# Patient Record
Sex: Female | Born: 1980 | Race: Black or African American | Hispanic: No | Marital: Married | Smoking: Never smoker
Health system: Southern US, Community
[De-identification: ages and names within clinical notes are randomized; demographics above are authoritative.]

## PROBLEM LIST (undated history)

## (undated) ENCOUNTER — Inpatient Hospital Stay (HOSPITAL_COMMUNITY): Payer: Self-pay

## (undated) DIAGNOSIS — R112 Nausea with vomiting, unspecified: Secondary | ICD-10-CM

## (undated) DIAGNOSIS — G40909 Epilepsy, unspecified, not intractable, without status epilepticus: Secondary | ICD-10-CM

## (undated) DIAGNOSIS — R0602 Shortness of breath: Secondary | ICD-10-CM

## (undated) DIAGNOSIS — G4730 Sleep apnea, unspecified: Secondary | ICD-10-CM

## (undated) DIAGNOSIS — K219 Gastro-esophageal reflux disease without esophagitis: Secondary | ICD-10-CM

## (undated) DIAGNOSIS — E669 Obesity, unspecified: Secondary | ICD-10-CM

## (undated) DIAGNOSIS — Z9889 Other specified postprocedural states: Secondary | ICD-10-CM

## (undated) DIAGNOSIS — G43909 Migraine, unspecified, not intractable, without status migrainosus: Secondary | ICD-10-CM

## (undated) DIAGNOSIS — J45909 Unspecified asthma, uncomplicated: Secondary | ICD-10-CM

## (undated) DIAGNOSIS — E119 Type 2 diabetes mellitus without complications: Secondary | ICD-10-CM

## (undated) DIAGNOSIS — R221 Localized swelling, mass and lump, neck: Secondary | ICD-10-CM

## (undated) HISTORY — PX: MOUTH SURGERY: SHX715

## (undated) HISTORY — DX: Obesity, unspecified: E66.9

## (undated) HISTORY — DX: Sleep apnea, unspecified: G47.30

## (undated) HISTORY — DX: Migraine, unspecified, not intractable, without status migrainosus: G43.909

## (undated) HISTORY — DX: Epilepsy, unspecified, not intractable, without status epilepticus: G40.909

## (undated) HISTORY — DX: Localized swelling, mass and lump, neck: R22.1

---
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
## 2000-12-08 ENCOUNTER — Other Ambulatory Visit: Admission: RE | Admit: 2000-12-08 | Discharge: 2000-12-08 | Payer: Self-pay | Admitting: Gynecology

## 2001-12-15 ENCOUNTER — Other Ambulatory Visit: Admission: RE | Admit: 2001-12-15 | Discharge: 2001-12-15 | Payer: Self-pay | Admitting: Gynecology

## 2002-01-25 ENCOUNTER — Encounter: Payer: Self-pay | Admitting: Emergency Medicine

## 2002-01-25 ENCOUNTER — Emergency Department (HOSPITAL_COMMUNITY): Admission: EM | Admit: 2002-01-25 | Discharge: 2002-01-25 | Payer: Self-pay | Admitting: Emergency Medicine

## 2002-07-02 ENCOUNTER — Encounter: Payer: Self-pay | Admitting: Emergency Medicine

## 2002-07-02 ENCOUNTER — Emergency Department (HOSPITAL_COMMUNITY): Admission: EM | Admit: 2002-07-02 | Discharge: 2002-07-02 | Payer: Self-pay | Admitting: Emergency Medicine

## 2003-01-01 ENCOUNTER — Other Ambulatory Visit: Admission: RE | Admit: 2003-01-01 | Discharge: 2003-01-01 | Payer: Self-pay | Admitting: Gynecology

## 2004-03-26 ENCOUNTER — Other Ambulatory Visit: Admission: RE | Admit: 2004-03-26 | Discharge: 2004-03-26 | Payer: Self-pay | Admitting: Gynecology

## 2006-12-10 ENCOUNTER — Emergency Department (HOSPITAL_COMMUNITY): Admission: EM | Admit: 2006-12-10 | Discharge: 2006-12-10 | Payer: Self-pay | Admitting: Emergency Medicine

## 2008-02-14 ENCOUNTER — Emergency Department (HOSPITAL_COMMUNITY): Admission: EM | Admit: 2008-02-14 | Discharge: 2008-02-15 | Payer: Self-pay | Admitting: Emergency Medicine

## 2008-09-03 ENCOUNTER — Emergency Department (HOSPITAL_COMMUNITY): Admission: EM | Admit: 2008-09-03 | Discharge: 2008-09-04 | Payer: Self-pay | Admitting: Emergency Medicine

## 2008-09-21 ENCOUNTER — Emergency Department (HOSPITAL_COMMUNITY): Admission: EM | Admit: 2008-09-21 | Discharge: 2008-09-21 | Payer: Self-pay | Admitting: Emergency Medicine

## 2008-11-19 ENCOUNTER — Inpatient Hospital Stay (HOSPITAL_COMMUNITY): Admission: AD | Admit: 2008-11-19 | Discharge: 2008-11-19 | Payer: Self-pay | Admitting: Obstetrics and Gynecology

## 2009-03-23 ENCOUNTER — Emergency Department (HOSPITAL_BASED_OUTPATIENT_CLINIC_OR_DEPARTMENT_OTHER): Admission: EM | Admit: 2009-03-23 | Discharge: 2009-03-23 | Payer: Self-pay | Admitting: Emergency Medicine

## 2009-04-16 ENCOUNTER — Inpatient Hospital Stay (HOSPITAL_COMMUNITY): Admission: AD | Admit: 2009-04-16 | Discharge: 2009-04-16 | Payer: Self-pay | Admitting: Obstetrics and Gynecology

## 2009-04-16 ENCOUNTER — Ambulatory Visit: Payer: Self-pay | Admitting: Advanced Practice Midwife

## 2009-04-21 ENCOUNTER — Inpatient Hospital Stay (HOSPITAL_COMMUNITY): Admission: AD | Admit: 2009-04-21 | Discharge: 2009-04-21 | Payer: Self-pay | Admitting: Obstetrics and Gynecology

## 2009-05-16 ENCOUNTER — Inpatient Hospital Stay (HOSPITAL_COMMUNITY): Admission: AD | Admit: 2009-05-16 | Discharge: 2009-05-16 | Payer: Self-pay | Admitting: Obstetrics and Gynecology

## 2009-06-17 ENCOUNTER — Other Ambulatory Visit: Payer: Self-pay | Admitting: Emergency Medicine

## 2009-06-17 ENCOUNTER — Inpatient Hospital Stay (HOSPITAL_COMMUNITY): Admission: AD | Admit: 2009-06-17 | Discharge: 2009-06-18 | Payer: Self-pay | Admitting: Obstetrics and Gynecology

## 2009-07-15 ENCOUNTER — Inpatient Hospital Stay (HOSPITAL_COMMUNITY): Admission: AD | Admit: 2009-07-15 | Discharge: 2009-07-20 | Payer: Self-pay | Admitting: Obstetrics and Gynecology

## 2009-07-21 ENCOUNTER — Inpatient Hospital Stay (HOSPITAL_COMMUNITY): Admission: AD | Admit: 2009-07-21 | Discharge: 2009-07-21 | Payer: Self-pay | Admitting: Obstetrics and Gynecology

## 2009-11-24 ENCOUNTER — Emergency Department (HOSPITAL_COMMUNITY): Admission: EM | Admit: 2009-11-24 | Discharge: 2009-11-24 | Payer: Self-pay | Admitting: Emergency Medicine

## 2010-07-10 ENCOUNTER — Emergency Department (HOSPITAL_COMMUNITY): Admission: EM | Admit: 2010-07-10 | Discharge: 2010-07-10 | Payer: Self-pay | Admitting: Emergency Medicine

## 2011-03-23 LAB — LIPASE, BLOOD: Lipase: 16 U/L (ref 11–59)

## 2011-03-23 LAB — COMPREHENSIVE METABOLIC PANEL
ALT: 15 U/L (ref 0–35)
AST: 19 U/L (ref 0–37)
CO2: 23 mEq/L (ref 19–32)
Calcium: 8.9 mg/dL (ref 8.4–10.5)
Chloride: 104 mEq/L (ref 96–112)
GFR calc Af Amer: >60 mL/min (ref >60)
GFR calc non Af Amer: >60 mL/min (ref >60)
Glucose, Bld: 101 mg/dL — ABNORMAL HIGH (ref 70–99)
Sodium: 137 mEq/L (ref 135–145)
Total Bilirubin: 0.6 mg/dL (ref 0.3–1.2)

## 2011-03-23 LAB — DIFFERENTIAL
Basophils Absolute: 0 10*3/uL (ref 0.0–0.1)
Basophils Relative: 0 % (ref 0–1)
Eosinophils Absolute: 0 10*3/uL (ref 0.0–0.7)
Eosinophils Relative: 0 % (ref 0–5)
Lymphs Abs: 0.7 10*3/uL (ref 0.7–4.0)
Neutrophils Relative %: 92 % — ABNORMAL HIGH (ref 43–77)

## 2011-03-23 LAB — CBC
Hemoglobin: 11.9 g/dL — ABNORMAL LOW (ref 12.0–15.0)
MCHC: 33.2 g/dL (ref 30.0–36.0)
MCV: 76.1 fL — ABNORMAL LOW (ref 78.0–100.0)
RBC: 4.72 MIL/uL (ref 3.87–5.11)
WBC: 14 10*3/uL — ABNORMAL HIGH (ref 4.0–10.5)

## 2011-03-23 LAB — URINALYSIS, ROUTINE W REFLEX MICROSCOPIC
Bilirubin Urine: NEGATIVE
Hgb urine dipstick: NEGATIVE
Nitrite: NEGATIVE
Specific Gravity, Urine: 1.027 (ref 1.005–1.030)
pH: 5.5 (ref 5.0–8.0)

## 2011-03-23 LAB — POCT PREGNANCY, URINE: Preg Test, Ur: NEGATIVE

## 2011-03-27 LAB — URINALYSIS, ROUTINE W REFLEX MICROSCOPIC
Glucose, UA: NEGATIVE mg/dL
Hgb urine dipstick: NEGATIVE
Ketones, ur: NEGATIVE mg/dL
pH: 5.5 (ref 5.0–8.0)

## 2011-03-27 LAB — COMPREHENSIVE METABOLIC PANEL
ALT: 29 U/L (ref 0–35)
Albumin: 2.3 g/dL — ABNORMAL LOW (ref 3.5–5.2)
Alkaline Phosphatase: 158 U/L — ABNORMAL HIGH (ref 39–117)
Calcium: 7.9 mg/dL — ABNORMAL LOW (ref 8.4–10.5)
GFR calc Af Amer: >60 mL/min (ref >60)
Glucose, Bld: 79 mg/dL (ref 70–99)
Potassium: 3.3 mEq/L — ABNORMAL LOW (ref 3.5–5.1)
Sodium: 135 mEq/L (ref 135–145)
Total Protein: 5.7 g/dL — ABNORMAL LOW (ref 6.0–8.3)

## 2011-03-27 LAB — CBC
Hemoglobin: 8.7 g/dL — ABNORMAL LOW (ref 12.0–15.0)
MCHC: 32.7 g/dL (ref 30.0–36.0)
Platelets: 269 10*3/uL (ref 150–400)
RDW: 17.5 % — ABNORMAL HIGH (ref 11.5–15.5)

## 2011-03-28 LAB — URINALYSIS, ROUTINE W REFLEX MICROSCOPIC
Nitrite: NEGATIVE
Specific Gravity, Urine: 1.015 (ref 1.005–1.030)
Urobilinogen, UA: 0.2 mg/dL (ref 0.0–1.0)

## 2011-03-28 LAB — COMPREHENSIVE METABOLIC PANEL
CO2: 24 mEq/L (ref 19–32)
Calcium: 8.1 mg/dL — ABNORMAL LOW (ref 8.4–10.5)
Creatinine, Ser: 0.81 mg/dL (ref 0.4–1.2)
GFR calc non Af Amer: >60 mL/min (ref >60)
Glucose, Bld: 92 mg/dL (ref 70–99)

## 2011-03-28 LAB — CBC
HCT: 32.5 % — ABNORMAL LOW (ref 36.0–46.0)
Hemoglobin: 10.4 g/dL — ABNORMAL LOW (ref 12.0–15.0)
Hemoglobin: 10.8 g/dL — ABNORMAL LOW (ref 12.0–15.0)
MCHC: 33 g/dL (ref 30.0–36.0)
MCV: 75.1 fL — ABNORMAL LOW (ref 78.0–100.0)
MCV: 76.9 fL — ABNORMAL LOW (ref 78.0–100.0)
RBC: 4.09 MIL/uL (ref 3.87–5.11)
RDW: 16.9 % — ABNORMAL HIGH (ref 11.5–15.5)
RDW: 17.6 % — ABNORMAL HIGH (ref 11.5–15.5)
WBC: 13.2 10*3/uL — ABNORMAL HIGH (ref 4.0–10.5)

## 2011-03-28 LAB — URINE MICROSCOPIC-ADD ON

## 2011-03-28 LAB — URIC ACID: Uric Acid, Serum: 4.9 mg/dL (ref 2.4–7.0)

## 2011-03-29 LAB — COMPREHENSIVE METABOLIC PANEL
BUN: 3 mg/dL — ABNORMAL LOW (ref 6–23)
Calcium: 8.7 mg/dL (ref 8.4–10.5)
Creatinine, Ser: 0.57 mg/dL (ref 0.4–1.2)
Glucose, Bld: 96 mg/dL (ref 70–99)
Sodium: 137 mEq/L (ref 135–145)
Total Protein: 5.9 g/dL — ABNORMAL LOW (ref 6.0–8.3)

## 2011-03-29 LAB — URINE CULTURE

## 2011-03-29 LAB — CBC
HCT: 31.2 % — ABNORMAL LOW (ref 36.0–46.0)
Hemoglobin: 10.3 g/dL — ABNORMAL LOW (ref 12.0–15.0)
MCHC: 33 g/dL (ref 30.0–36.0)
RDW: 16.4 % — ABNORMAL HIGH (ref 11.5–15.5)

## 2011-03-29 LAB — LACTATE DEHYDROGENASE: LDH: 111 U/L (ref 94–250)

## 2011-03-29 LAB — URIC ACID: Uric Acid, Serum: 2.9 mg/dL (ref 2.4–7.0)

## 2011-03-29 LAB — URINALYSIS, ROUTINE W REFLEX MICROSCOPIC
Ketones, ur: NEGATIVE mg/dL
Nitrite: NEGATIVE
pH: 7 (ref 5.0–8.0)

## 2011-03-29 LAB — URINE MICROSCOPIC-ADD ON

## 2011-05-04 NOTE — Discharge Summary (Signed)
Jaime Riggs, Jaime Riggs              ACCOUNT NO.:  [id number]   MEDICAL RECORD NO.:  [id number]          PATIENT TYPE:  INP   LOCATION:  9309                          FACILITY:  [facility]   PHYSICIAN:  Malachi Pro. Ambrose Mantle, M.D. DATE OF BIRTH:  September 29, 1981   DATE OF ADMISSION:  07/15/2009  DATE OF DISCHARGE:  07/20/2009                               DISCHARGE SUMMARY   HISTORY:  A 30 year old black female, para 0, gravida 1, estimated  gestational age 59 plus weeks by 11-week ultrasound with EDC on July 10, 2009, presented complaining of contractions.  She was seen in the office  earlier on the day of admission, had nausea and vomiting.  Vaginal exam  showed the cervix to be 2-3 cm, 50%, vertex at -3.  The patient came to  maternity admission with irregular contractions and she was admitted for  augmentation.  RPR was nonreactive, hepatitis B surface antigen  negative, rubella immune, HIV negative, O positive, negative antibody,  GC and chlamydia negative, and cystic fibrosis negative.  First  trimester screen was normal.  One-hour Glucola 115.  Group B strep  negative.   PRENATAL CARE:  The patient was seen [facility] for  headache.  She was treated with trigger point injections, had  intermittent nausea and vomiting, questionable seizure at 36 weeks with  normal evaluation at [facility].  She was on Valtrex for  suppression of HSV.  No lesions or symptoms.   GYN HISTORY:  History of GC and chlamydia and herpes simplex.   PAST MEDICAL HISTORY:  Remote history of asthma, anxiety, depression,  seizure in 2003, and migraines.   SURGICAL HISTORY:  Wisdom teeth.   ALLERGIES:  No known drug allergies.   MEDICATIONS:  Valtrex.   On admission, she was afebrile with normal vital signs.  Fetal heart  tones were normal.  Estimated fetal weight was 9 pounds.  Cervix was 2-3  cm and 50%.  Heart and lungs are normal.  The patient was admitted and  placed on Pitocin.  She  received an epidural by 7:05 a.m. her cervix was  6 cm, 80%, vertex at a -1.  Artificial rupture of membranes produced  clear fluid.  The patient had fallen out of bed earlier in this morning,  9:15 a.m., the cervix was 8 cm, 90%.  She progressed to 9 cm at 1:15  p.m., and at 2:15 p.m., the patient was fully dilated.  She pushed well,  but delivered failed to descend and the patient was taken the operating  room for low transverse cervical C-section by Dr. Senaida Ores, with  delivery of an 8 pounds 7 ounces female infant with Apgars of 8 at 1 and  9at 5 minutes, baby was in OP position.  Postoperatively, the patient  was relatively unremarkable.   POSTNATAL COURSE:  The baby was taken to the ICU because of possible  infection and will be kept for 7 days.  Because of this, the patient did  not want to go home and was kept until the force postop day, at which  time her staples were  removed and strips were applied.  Initial  hemoglobin was 10.8, hematocrit 32.5, white count 13,200, and platelet  count 169,000.  Followup hemoglobin 10.4.  Comprehensive metabolic  profile was normal except for sodium of 134, BUN of 5, total protein of  5.4, albumin of 2.3, and alkaline phosphatase at 144.  Urinalysis was  normal except for 1+ protein 30 mg/dL.  RPR was nonreactive.  At the  time of discharge, the patient is ambulating well.  She is tolerating a  regular diet and passing flatus.  Her last prenatal weight that we had  recorded on July 08, 2009, was 270 pounds, and on July 19, 2009, she did  weight 277 pounds.  She does have significant edema, but her blood  pressures normal.  She was advised to low-salt diet and frequent  ambulation.   DISCHARGE DIAGNOSIS:  Intrauterine pregnancy at 40 plus weeks, delivered  vertex by cesarean section.   OPERATION:  Low-transverse cervical cesarean section.   FINAL CONDITION:  Improved.   INSTRUCTIONS:  Our regular discharge instruction booklet.   DISCHARGE  MEDICATIONS:  1. Percocet 5/325, 36 tablets, 1 every 4-6 hours as needed for pain.  2. Motrin 600 mg 30 tablets 1 every 6 hours as needed.   FOLLOWUP:  The patient was advised to call with any signs or symptoms of  postpartum depression, and to avoid salt.  Return to the office in 10-14  days.      Malachi Pro. Ambrose Mantle, M.D.  Electronically Signed     TFH/MEDQ  D:  07/20/2009  T:  07/20/2009  Job:  [id number]

## 2011-05-04 NOTE — Op Note (Signed)
Jaime Riggs, Jaime Riggs NO.:  [id number]   MEDICAL RECORD NO.:  [id number]           PATIENT TYPE:   LOCATION:                                 FACILITY:   PHYSICIAN:  Huel Cote, M.D. DATE OF BIRTH:  02/10/1981   DATE OF PROCEDURE:  07/16/2009  DATE OF DISCHARGE:                               OPERATIVE REPORT   PREOPERATIVE DIAGNOSES:  1. Term pregnancy at 40-6/7th weeks, delivered  2. Arrest of descent.  3. Fetal bradycardia in OR 260.   PROCEDURE:  Primary low transverse cesarean section with two-layer  closure of uterus.   SURGEON:  Huel Cote, MD   ANESTHESIA:  Epidural.   FINDINGS:  There is a viable female infant in the vertex presentation, OP  position.  Apgars were 8 and 9, weight was 8 pounds 7 ounces.  Normal  uterus, tubes, and ovaries were noted.   SPECIMENS:  Placenta was taken to L&D after cord blood collection.   ESTIMATED BLOOD LOSS:  800 mL.   URINE OUTPUT:  Approximately 100 mL clear urine.   INTRAVENOUS FLUIDS:  1500 mL LR.   PROCEDURE:  The patient was taken to the operating room where epidural  anesthesia was dosed upon arrival to the OR.  The fetal scalp electrode  was removed and the heart rate was identified with electronic fetal  monitoring externally.  At this point, the fetal heart rate was noted to  be 60 and position change was attempted with no discernible recovery of  heart rate.  The patient was ready for C-section, so her abdomen was  quickly prepped and sterile drape applied with the abdomen tested and  found to be adequately anesthetized.  A Pfannenstiel incision was  quickly made and carried through to underlying layer of fascia by sharp  dissection and Bovie cautery.  The fascia was nicked in the midline and  the incision extended laterally with Mayo scissors.  The inferior aspect  was elevated and dissected off the rectus muscles.  Superior aspect  similarly was dissected off the rectus muscles.  These  were separated in  the midline and the incision extended bluntly.  When the peritoneum had  been entered, an Alexis self-retaining wound retractor was placed within  the incision and the uterus incised in low transverse fashion.  The  infant was in the OP presentation and the vertex was deep within the  pelvis.  The vertex was delivered up to the level of the incision and  fundal pressure applied with no delivery.  Therefore, the soft cup  vacuum was applied and the infant's head delivered atraumatically, and  nose and mouth bulb suctioned.  The remainder of the infant delivered  atraumatically.  Cord was clamped and the infant did have spontaneous  cry, and was handed off to the pediatricians.  Apgars were 8 and 9 as  stated.  Upon delivery of the infant, the uterus was massaged and the  placenta expressed spontaneously and handed off for cord blood donation.  The uterus was cleared off all clots and debris with moist lap sponge  and was  noted to be somewhat atonic; therefore, the Pitocin was  increased and Methergine given x1.  The uterus responded well and at  this point, the uterine incision was then closed with 2 layers of 0  chromic, the first a running locked layer, and the second an imbricating  layer of the same suture.  The incision then appeared hemostatic.  The  pelvis was irrigated.  The ovaries and tubes inspected and found to be  normal.  At this point, there was no active bleeding noted and all  instruments were removed from the patient's abdomen.  The peritoneum and  muscles were reapproximated with 0 Vicryl in the midline and several  interrupted sutures.  The fascia was then closed with 0 Vicryl in a  running fashion.  The subcutaneous tissue was reapproximated with 3-0  plain in a running fashion and the skin was closed with staples.  Again,  sponge, lap, and needle counts were correct x2.  The patient was doing  well.  The baby was taken to the regular nursery and will  follow up with  the patient.  The patient was taken to the recovery room in good  condition.      Huel Cote, M.D.  Electronically Signed     KR/MEDQ  D:  07/16/2009  T:  07/17/2009  Job:  [id number]

## 2011-06-11 ENCOUNTER — Emergency Department (HOSPITAL_COMMUNITY)
Admission: EM | Admit: 2011-06-11 | Discharge: 2011-06-11 | Disposition: A | Discharge status: Home / Self Care | Payer: Self-pay | Attending: Emergency Medicine | Admitting: Emergency Medicine

## 2011-06-11 DIAGNOSIS — D649 Anemia, unspecified: Secondary | ICD-10-CM | POA: Insufficient documentation

## 2011-06-11 DIAGNOSIS — R4182 Altered mental status, unspecified: Secondary | ICD-10-CM | POA: Insufficient documentation

## 2011-06-11 LAB — BASIC METABOLIC PANEL
BUN: 12 mg/dL (ref 6–23)
Chloride: 102 mEq/L (ref 96–112)
GFR calc Af Amer: >60 mL/min (ref >60)
Glucose, Bld: 78 mg/dL (ref 70–99)
Potassium: 3.4 mEq/L — ABNORMAL LOW (ref 3.5–5.1)

## 2011-06-11 LAB — URINALYSIS, ROUTINE W REFLEX MICROSCOPIC
Hgb urine dipstick: NEGATIVE
Protein, ur: NEGATIVE mg/dL
Urobilinogen, UA: 0.2 mg/dL (ref 0.0–1.0)

## 2011-06-11 LAB — CBC
HCT: 30.9 % — ABNORMAL LOW (ref 36.0–46.0)
Hemoglobin: 9.9 g/dL — ABNORMAL LOW (ref 12.0–15.0)
WBC: 12.2 10*3/uL — ABNORMAL HIGH (ref 4.0–10.5)

## 2011-06-11 LAB — URINE MICROSCOPIC-ADD ON

## 2011-06-11 LAB — DIFFERENTIAL
Basophils Absolute: 0 10*3/uL (ref 0.0–0.1)
Lymphocytes Relative: 22 % (ref 12–46)
Neutro Abs: 8.5 10*3/uL — ABNORMAL HIGH (ref 1.7–7.7)
Neutrophils Relative %: 70 % (ref 43–77)

## 2011-06-11 LAB — POCT PREGNANCY, URINE: Preg Test, Ur: NEGATIVE

## 2011-09-10 LAB — DIFFERENTIAL
Basophils Absolute: 0.2 — ABNORMAL HIGH
Basophils Relative: 1
Eosinophils Relative: 0
Monocytes Absolute: 0.8

## 2011-09-10 LAB — I-STAT 8, (EC8 V) (CONVERTED LAB)
Acid-Base Excess: 1
BUN: 6
Bicarbonate: 26 — ABNORMAL HIGH
Chloride: 106
Glucose, Bld: 92
HCT: 42
Hemoglobin: 14.3
Operator id: 196461
Potassium: 5.1
Sodium: 138
TCO2: 27
pCO2, Ven: 41.4 — ABNORMAL LOW
pH, Ven: 7.406 — ABNORMAL HIGH

## 2011-09-10 LAB — URINALYSIS, ROUTINE W REFLEX MICROSCOPIC
Glucose, UA: NEGATIVE
Protein, ur: 30 — AB

## 2011-09-10 LAB — URINE MICROSCOPIC-ADD ON

## 2011-09-10 LAB — CBC
HCT: 34.7 — ABNORMAL LOW
Hemoglobin: 11 — ABNORMAL LOW
MCHC: 31.6
RDW: 17.2 — ABNORMAL HIGH

## 2011-09-10 LAB — POCT I-STAT CREATININE
Creatinine, Ser: 1
Operator id: 196461

## 2012-01-19 ENCOUNTER — Emergency Department (INDEPENDENT_AMBULATORY_CARE_PROVIDER_SITE_OTHER)
Admission: EM | Admit: 2012-01-19 | Discharge: 2012-01-19 | Disposition: A | Discharge status: Home / Self Care | Payer: Medicaid Other | Source: Home / Self Care | Attending: Family Medicine | Admitting: Family Medicine

## 2012-01-19 ENCOUNTER — Encounter (HOSPITAL_COMMUNITY): Payer: Self-pay | Admitting: *Deleted

## 2012-01-19 DIAGNOSIS — J111 Influenza due to unidentified influenza virus with other respiratory manifestations: Secondary | ICD-10-CM

## 2012-01-19 MED ORDER — OSELTAMIVIR PHOSPHATE 75 MG PO CAPS: 75.0000 mg | ORAL_CAPSULE | Freq: Two times a day (BID) | ORAL | Status: AC

## 2012-01-19 NOTE — ED Notes (Signed)
Pt  Has  Symptoms  Of  Cough   Congestion  Body  Aches   sorethroat     X  1  Day   She  Is  In  Private  Room  Masked    Appears  In no  Acute  Distress

## 2012-01-19 NOTE — ED Provider Notes (Signed)
History     CSN: [id number]  Arrival date & time 01/19/12  1123   First MD Initiated Contact with Patient 01/19/12 1203      Chief Complaint  Patient presents with  . Cough    (Consider location/radiation/quality/duration/timing/severity/associated sxs/prior treatment) Patient is a 31 y.o. female presenting with cough. The history is provided by the patient.  Cough This is a new problem. The current episode started yesterday. The problem occurs constantly. The problem has been gradually worsening. The cough is non-productive. There has been no fever. Associated symptoms include chills, sore throat and myalgias. Risk factors: did not take flu shot this season. She is not a smoker.    History reviewed. No pertinent past medical history.  Past Surgical History  Procedure Date  . Cesarean section     History reviewed. No pertinent family history.  History  Substance Use Topics  . Smoking status: Not on file  . Smokeless tobacco: Not on file  . Alcohol Use:     OB History    Grav Para Term Preterm Abortions TAB SAB Ect Mult Living                  Review of Systems  Constitutional: Positive for chills. Negative for fever.  HENT: Positive for congestion and sore throat.   Respiratory: Positive for cough.   Gastrointestinal: Negative.   Musculoskeletal: Positive for myalgias.  Skin: Negative.     Allergies  Review of patient's allergies indicates no known allergies.  Home Medications   Current Outpatient Rx  Name Route Sig Dispense Refill  . OSELTAMIVIR PHOSPHATE 75 MG PO CAPS Oral Take 1 capsule (75 mg total) by mouth every 12 (twelve) hours. 10 capsule 0    BP 141/84  Pulse 82  Temp(Src) 98.7 F (37.1 C) (Oral)  Resp 20  SpO2 100%  LMP 01/04/2012  Physical Exam  Nursing note and vitals reviewed. Constitutional: She is oriented to person, place, and time. She appears well-developed and well-nourished.  HENT:  Head: Normocephalic.  Right Ear:  External ear normal.  Left Ear: External ear normal.  Nose: Nose normal.  Mouth/Throat: Oropharynx is clear and moist.  Eyes: Pupils are equal, round, and reactive to light.  Neck: Normal range of motion. Neck supple.  Cardiovascular: Normal rate, regular rhythm, normal heart sounds and intact distal pulses.   Pulmonary/Chest: Effort normal and breath sounds normal.  Lymphadenopathy:    She has no cervical adenopathy.  Neurological: She is alert and oriented to person, place, and time.  Skin: Skin is warm and dry.    ED Course  Procedures (including critical care time)  Labs Reviewed - No data to display No results found.   1. Influenza       MDM          Barkley Bruns, MD 01/19/12 [phone]

## 2012-03-27 ENCOUNTER — Encounter (INDEPENDENT_AMBULATORY_CARE_PROVIDER_SITE_OTHER): Payer: Self-pay | Admitting: Surgery

## 2012-03-27 ENCOUNTER — Ambulatory Visit (INDEPENDENT_AMBULATORY_CARE_PROVIDER_SITE_OTHER): Payer: Medicaid Other | Admitting: Surgery

## 2012-03-27 VITALS — BP 122/84 | HR 88 | Temp 98.6°F | Resp 18 | Ht 67.0 in | Wt 271.8 lb

## 2012-03-27 DIAGNOSIS — R220 Localized swelling, mass and lump, head: Secondary | ICD-10-CM

## 2012-03-27 DIAGNOSIS — D171 Benign lipomatous neoplasm of skin and subcutaneous tissue of trunk: Secondary | ICD-10-CM | POA: Insufficient documentation

## 2012-03-27 DIAGNOSIS — E669 Obesity, unspecified: Secondary | ICD-10-CM

## 2012-03-27 DIAGNOSIS — R221 Localized swelling, mass and lump, neck: Secondary | ICD-10-CM

## 2012-03-27 HISTORY — DX: Obesity, unspecified: E66.9

## 2012-03-27 NOTE — Progress Notes (Signed)
Subjective:     Patient ID: Jaime Riggs, female   DOB: 24-Nov-1981, 31 y.o.   MRN: [id number]  HPI  Niyah Mamaril  Oct 04, 1981 [id number]  Patient Care Team: Jearld Lesch as PCP - General (Specialist)  This patient is a 31 y.o.female who presents today for surgical evaluation at the request of Dr. Mayford Knife.    Reason for visit: Enlarging posterior neck mass.  Patient is a morbidly obese pleasant female. She has had a mass on the back of her neck for many years. It was not particularly tender nor large. However, it has gotten much larger in the past year. She gets a lot of pain and discomfort with it now. Occasionally radiates to her back or to her chest. No other lesions except for a small lump in front of her right chin which he had since she was born. That has not changed. She saw her primary care physician. Recommended considering surgery.  She seems to be getting over a cold with some sniffling and cough. No swollen lymph nodes elsewhere. No other skin lesions. No history of MRSA. No tobacco  Patient Active Problem List  Diagnoses  . Mass of posterior neck 12x9x6cm  . Obesity, Class III, BMI 40-49.9 (morbid obesity)    Past Medical History  Diagnosis Date  . Migraines   . Neck mass   . Sleep apnea   . Epilepsy   . Obesity (BMI 30-39.9) 03/27/2012    Past Surgical History  Procedure Date  . Cesarean section   . Mouth surgery     History   Social History  . Marital Status: Single    Spouse Name: N/A    Number of Children: N/A  . Years of Education: N/A   Occupational History  . Not on file.   Social History Main Topics  . Smoking status: Never Smoker   . Smokeless tobacco: Not on file  . Alcohol Use: No  . Drug Use: No  . Sexually Active:    Other Topics Concern  . Not on file   Social History Narrative  . No narrative on file    Family History  Problem Relation Age of Onset  . Cancer Maternal Aunt     unknown  . Cancer Maternal Grandfather       lung  . Cancer Paternal Grandmother     ovarian    No current outpatient prescriptions on file.     No Known Allergies  BP 122/84  Pulse 88  Temp(Src) 98.6 F (37 C) (Temporal)  Resp 18  Ht 5\' 7"  (1.702 m)  Wt 271 lb 12.8 oz (123.288 kg)  BMI 42.57 kg/m2     Review of Systems  Constitutional: Negative for fever, chills, diaphoresis, appetite change and fatigue.  HENT: Positive for congestion and neck pain. Negative for hearing loss, ear pain, sore throat, trouble swallowing and ear discharge.   Eyes: Negative for photophobia, discharge and visual disturbance.  Respiratory: Positive for cough. Negative for choking, chest tightness and shortness of breath.   Cardiovascular: Positive for chest pain. Negative for palpitations.  Gastrointestinal: Negative for nausea, vomiting, abdominal pain, diarrhea, constipation, anal bleeding and rectal pain.  Genitourinary: Negative for dysuria, frequency and difficulty urinating.  Musculoskeletal: Negative for myalgias and gait problem.  Skin: Negative for color change, pallor and rash.  Neurological: Positive for weakness and headaches. Negative for dizziness, speech difficulty and numbness.  Hematological: Negative for adenopathy.  Psychiatric/Behavioral: Negative for confusion and agitation. The patient is not  nervous/anxious.        Objective:   Physical Exam  Constitutional: She is oriented to person, place, and time. She appears well-developed and well-nourished. No distress.  HENT:  Head: Normocephalic.  Mouth/Throat: Oropharynx is clear and moist. No oropharyngeal exudate.  Eyes: Conjunctivae and EOM are normal. Pupils are equal, round, and reactive to light. No scleral icterus.  Neck: Normal range of motion. Neck supple. No tracheal deviation present.    Cardiovascular: Normal rate, regular rhythm and intact distal pulses.   Pulmonary/Chest: Effort normal and breath sounds normal. No respiratory distress. She exhibits no  tenderness.  Abdominal: Soft. She exhibits no distension and no mass. There is no tenderness. Hernia confirmed negative in the right inguinal area and confirmed negative in the left inguinal area.  Genitourinary: No vaginal discharge found.  Musculoskeletal: Normal range of motion. She exhibits no tenderness.  Lymphadenopathy:    She has no cervical adenopathy.       Right: No inguinal adenopathy present.       Left: No inguinal adenopathy present.  Neurological: She is alert and oriented to person, place, and time. No cranial nerve deficit. She exhibits normal muscle tone. Coordination normal.  Skin: Skin is warm and dry. No rash noted. She is not diaphoretic. No erythema.  Psychiatric: She has a normal mood and affect. Her behavior is normal. Judgment and thought content normal.       Assessment:     Enlarging painful posterior neck mass, favor lipoma but muscle soreness a concern    Plan:     I think she would benefit from removal since it has gotten larger and become more tender. She is very interested in this. Decubitus position. Hopefully just with local & monitored deep sedation. However, given how sensitive it is & its size, she may require general anesthesia. She is concerned about postoperative nausea and vomiting since she had this after her dental surgery. I encouraged her to discuss it with the anesthesiologist so they can develop a plan to minimize such problems   I did discuss the procedure with her:  The pathophysiology of skin & subcutaneous masses was discussed.  Natural history risks without surgery were discussed.  I recommended surgery to remove the mass.  I explained the technique of removal with use of local anesthesia & possible need for more aggressive sedation/anesthesia for patient comfort.    Risks such as bleeding, infection, heart attack, death, and other risks were discussed.  I noted a good likelihood this will help address the problem.   Possibility that  this will not correct all symptoms was explained. Possibility of regrowth/recurrence of the mass was discussed.  We will work to minimize complications. Questions were answered.  The patient expresses understanding & wishes to proceed with surgery.

## 2012-03-27 NOTE — Patient Instructions (Addendum)
You most likely have a Lipoma on the back of your neck A lipoma is a noncancerous (benign) tumor composed of fat cells. They are usually found under the skin (subcutaneous). A lipoma may occur in any tissue of the body that contains fat. Common areas for lipomas to appear include the back, shoulders, buttocks, and thighs. Lipomas are a very common soft tissue growth. They are soft and grow slowly. Most problems caused by a lipoma depend on where it is growing. DIAGNOSIS  A lipoma can be diagnosed with a physical exam. These tumors rarely become cancerous, but radiographic studies can help determine this for certain. Studies used may include:  Computerized X-ray scans (CT or CAT scan).   Computerized magnetic scans (MRI).  TREATMENT  Small lipomas that are not causing problems may be watched. If a lipoma continues to enlarge or causes problems, removal is often the best treatment. Lipomas can also be removed to improve appearance. Surgery is done to remove the fatty cells and the surrounding capsule. Most often, this is done with medicine that numbs the area (local anesthetic). The removed tissue is examined under a microscope to make sure it is not cancerous. Keep all follow-up appointments with your caregiver. SEEK MEDICAL CARE IF:   The lipoma becomes larger or hard.   The lipoma becomes painful, red, or increasingly swollen. These could be signs of infection or a more serious condition.  Document Released: 11/26/2002 Document Revised: 11/25/2011 Document Reviewed: 05/08/2010 [facility] Patient Information 2012 [place], [place].

## 2012-04-04 ENCOUNTER — Other Ambulatory Visit (INDEPENDENT_AMBULATORY_CARE_PROVIDER_SITE_OTHER): Payer: Self-pay | Admitting: Surgery

## 2012-04-04 DIAGNOSIS — D210 Benign neoplasm of connective and other soft tissue of head, face and neck: Secondary | ICD-10-CM

## 2012-04-04 HISTORY — PX: NECK MASS EXCISION: SHX2079

## 2012-04-07 ENCOUNTER — Encounter (INDEPENDENT_AMBULATORY_CARE_PROVIDER_SITE_OTHER): Payer: Self-pay | Admitting: Surgery

## 2012-04-07 ENCOUNTER — Ambulatory Visit (INDEPENDENT_AMBULATORY_CARE_PROVIDER_SITE_OTHER): Payer: Medicaid Other | Admitting: Surgery

## 2012-04-07 VITALS — BP 128/72 | HR 68 | Temp 96.8°F | Resp 16 | Ht 66.0 in | Wt 274.0 lb

## 2012-04-07 DIAGNOSIS — D171 Benign lipomatous neoplasm of skin and subcutaneous tissue of trunk: Secondary | ICD-10-CM

## 2012-04-07 DIAGNOSIS — D1779 Benign lipomatous neoplasm of other sites: Secondary | ICD-10-CM

## 2012-04-07 NOTE — Progress Notes (Signed)
Subjective:     Patient ID: Jaime Riggs, female   DOB: 08-02-1981, 31 y.o.   MRN: [id number]  HPI  Jaime Riggs  [birth year] [id number]  Patient Care Team: Jearld Lesch as PCP - General (Specialist)  This patient is a 31 y.o.female who presents today for surgical evaluation.   Procedure: Removal of lipomatous neck mass 04/04/2012  Pathology: Benign fatty fibroadipose tissue. Consistent with lipoma.  Patient comes today with her significant other. They were concerned about the incision. Drain has been putting a moderate volume but is relatively thin. Pain is controlled. She is afraid use her arm. She is hoping to be more active. No fevers or chills. No drainage from the incision. Neck little sore.  Patient Active Problem List  Diagnoses  . Lipoma of posterior neck/upper back 12x9x6cm  . Obesity, Class III, BMI 40-49.9 (morbid obesity)    Past Medical History  Diagnosis Date  . Migraines   . Neck mass   . Sleep apnea   . Epilepsy   . Obesity (BMI 30-39.9) 03/27/2012    Past Surgical History  Procedure Date  . Cesarean section   . Mouth surgery   . Neck mass excision 04/04/2012    pathology c/w lipoma    History   Social History  . Marital Status: Married    Spouse Name: N/A    Number of Children: N/A  . Years of Education: N/A   Occupational History  . Not on file.   Social History Main Topics  . Smoking status: Never Smoker   . Smokeless tobacco: Not on file  . Alcohol Use: No  . Drug Use: No  . Sexually Active: Not on file   Other Topics Concern  . Not on file   Social History Narrative  . No narrative on file    Family History  Problem Relation Age of Onset  . Cancer Maternal Aunt     unknown  . Cancer Maternal Grandfather     lung  . Cancer Paternal Grandmother     ovarian    No current outpatient prescriptions on file.     No Known Allergies  BP 128/72  Pulse 68  Temp(Src) 96.8 F (36 C) (Temporal)  Resp 16  Ht 5\' 6"   (1.676 m)  Wt 274 lb (124.286 kg)  BMI 44.22 kg/m2     Review of Systems  Constitutional: Negative for fever, chills and diaphoresis.  HENT: Positive for neck pain and neck stiffness. Negative for ear pain, sore throat and trouble swallowing.   Eyes: Negative for photophobia and visual disturbance.  Respiratory: Negative for cough and choking.   Cardiovascular: Negative for chest pain and palpitations.  Gastrointestinal: Negative for nausea, vomiting, abdominal pain, diarrhea, constipation, anal bleeding and rectal pain.  Genitourinary: Negative for dysuria, frequency and difficulty urinating.  Musculoskeletal: Negative for myalgias and gait problem.  Skin: Negative for color change, pallor and rash.  Neurological: Negative for dizziness, speech difficulty, weakness and numbness.  Hematological: Negative for adenopathy.  Psychiatric/Behavioral: Negative for confusion and agitation. The patient is not nervous/anxious.        Objective:   Physical Exam  Constitutional: She is oriented to person, place, and time. She appears well-developed and well-nourished. No distress.  HENT:  Head: Normocephalic.  Mouth/Throat: Oropharynx is clear and moist. No oropharyngeal exudate.  Eyes: Conjunctivae and EOM are normal. Pupils are equal, round, and reactive to light. No scleral icterus.  Neck: Normal range of motion. No tracheal deviation  present.    Cardiovascular: Normal rate and intact distal pulses.   Pulmonary/Chest: Effort normal. No respiratory distress. She exhibits no tenderness.  Abdominal: Soft. She exhibits no distension. There is no tenderness. Hernia confirmed negative in the right inguinal area and confirmed negative in the left inguinal area.       Incisions clean with normal healing ridges.  No hernias  Genitourinary: No vaginal discharge found.  Musculoskeletal: Normal range of motion. She exhibits no tenderness.  Lymphadenopathy:       Right: No inguinal adenopathy  present.       Left: No inguinal adenopathy present.  Neurological: She is alert and oriented to person, place, and time. No cranial nerve deficit. She exhibits normal muscle tone. Coordination normal.  Skin: Skin is warm and dry. No rash noted. She is not diaphoretic.  Psychiatric: She has a normal mood and affect. Her behavior is normal.       Assessment:     POD 3 s/p excision of large neck/back lipoma.  Recovering OK    Plan:     Increase activity as tolerated.  Do not push through pain.  I noted is important to have a good pain control regimen.  Use right upper tremor he has tolerated. Do not push through pain. Okay to lie on back inside as tolerated. I spent a fair time going over postoperative instructions again and tried to reassure them. They felt more comfortable with management after our discussion  Return to clinic ~2 weeks for probable drain removal & follow-up.  The patient &signif. Other expressed understanding and appreciation

## 2012-04-07 NOTE — Patient Instructions (Addendum)
DRAIN CARE:   °You have a closed bulb drain to help you heal. ° °A bulb drain is a small, plastic reservoir which creates a gentle suction. It is used to remove excess fluid from a surgical wound. The color and amount of fluid will vary. Immediately after surgery, the fluid is bright red. It may gradually change to a yellow color. When the amount decreases to about 1 or 2 tablespoons (15 to 30 cc) per 24 hours, your caregiver will usually remove it. ° °DAILY CARE °· Keep the bulb compressed at all times, except while emptying it. The compression creates suction.  °· Keep sites where the tubes enter the skin dry and covered with a light bandage (dressing).  °· Tape the tubes to your skin, 1 to 2 inches below the insertion sites, to keep from pulling on your stitches. Tubes are stitched in place and will not slip out.  °· Pin the bulb to your shirt (not to your pants) with a safety pin.  °· For the first few days after surgery, there usually is more fluid in the bulb. Empty the bulb whenever it becomes half full because the bulb does not create enough suction if it is too full. Include this amount in your 24 hour totals.  °· When the amount of drainage decreases, empty the bulb at the same time every day. Write down the amounts and the 24 hour totals. Your caregiver will want to know them. This helps your caregiver know when the tubes can be removed.  °· (We anticipate removing the drain in 1-3 weeks, depending on when the output is <30mL a day for 2+ days) °· If there is drainage around the tube sites, change dressings and keep the area dry. If you see a clot in the tube, leave it alone. However, if the tube does not appear to be draining, let your caregiver know.  °TO EMPTY THE BULB °· Open the stopper to release suction.  °· Holding the stopper out of the way, pour drainage into the measuring cup that was sent home with you.  °· Measure and write down the amount. If there are 2 bulbs, note the amount of drainage  from bulb 1 or bulb 2 and keep the totals separate. Your caregiver will want to know which tube is draining more.  °· Compress the bulb by folding it in half.  °· Replace the stopper.  °· Check the tape that holds the tube to your skin, and pin the bulb to your shirt.  °SEEK MEDICAL CARE IF: °· The drainage develops a bad odor.  °· You have an oral temperature above 102° F (38.9° C).  °· The amount of drainage from your wound suddenly increases or decreases.  °· You accidentally pull out your drain.  °· You have any other questions or concerns.  °MAKE SURE YOU:  °· Understand these instructions.  °· Will watch your condition.  °· Will get help right away if you are not doing well or get worse.  ° ° °· Call our office if you have any questions about your drain. [phone] °·  °

## 2012-04-19 ENCOUNTER — Encounter (INDEPENDENT_AMBULATORY_CARE_PROVIDER_SITE_OTHER): Payer: Self-pay | Admitting: Surgery

## 2012-04-19 ENCOUNTER — Ambulatory Visit (INDEPENDENT_AMBULATORY_CARE_PROVIDER_SITE_OTHER): Payer: Medicaid Other | Admitting: Surgery

## 2012-04-19 VITALS — BP 127/81 | HR 85 | Temp 98.6°F | Ht 66.0 in | Wt 275.2 lb

## 2012-04-19 DIAGNOSIS — D171 Benign lipomatous neoplasm of skin and subcutaneous tissue of trunk: Secondary | ICD-10-CM

## 2012-04-19 DIAGNOSIS — D1779 Benign lipomatous neoplasm of other sites: Secondary | ICD-10-CM

## 2012-04-19 NOTE — Patient Instructions (Addendum)
GENERAL SURGERY: POST OP INSTRUCTIONS  1. DIET: Follow a light bland diet the first 24 hours after arrival home, such as soup, liquids, crackers, etc.  Be sure to include lots of fluids daily.  Avoid fast food or heavy meals as your are more likely to get nauseated.   2. Take your usually prescribed home medications unless otherwise directed. 3. PAIN CONTROL: a. Pain is best controlled by a usual combination of three different methods TOGETHER: i. Ice/Heat ii. Over the counter pain medication iii. Prescription pain medication b. Most patients will experience some swelling and bruising around the incisions.  Ice packs or heating pads (30-60 minutes up to 6 times a day) will help. Use ice for the first few days to help decrease swelling and bruising, then switch to heat to help relax tight/sore spots and speed recovery.  Some people prefer to use ice alone, heat alone, alternating between ice & heat.  Experiment to what works for you.  Swelling and bruising can take several weeks to resolve.   c. It is helpful to take an over-the-counter pain medication regularly for the first few weeks.  Choose one of the following that works best for you: i. Naproxen (Aleve, etc)  Two 220mg tabs twice a day ii. Ibuprofen (Advil, etc) Three 200mg tabs four times a day (every meal & bedtime) iii. Acetaminophen (Tylenol, etc) 500-650mg four times a day (every meal & bedtime) d. A  prescription for pain medication (such as oxycodone, hydrocodone, etc) should be given to you upon discharge.  Take your pain medication as prescribed.  i. If you are having problems/concerns with the prescription medicine (does not control pain, nausea, vomiting, rash, itching, etc), please call us [phone] to see if we need to switch you to a different pain medicine that will work better for you and/or control your side effect better. ii. If you need a refill on your pain medication, please contact your pharmacy.  They will contact our  office to request authorization. Prescriptions will not be filled after 5 pm or on week-ends. 4. Avoid getting constipated.  Between the surgery and the pain medications, it is common to experience some constipation.  Increasing fluid intake and taking a fiber supplement (such as Metamucil, Citrucel, FiberCon, MiraLax, etc) 1-2 times a day regularly will usually help prevent this problem from occurring.  A mild laxative (prune juice, Milk of Magnesia, MiraLax, etc) should be taken according to package directions if there are no bowel movements after 48 hours.   5. Wash / shower every day.  You may shower over the dressings as they are waterproof.  Continue to shower over incision(s) after the dressing is off. 6. Remove your waterproof bandages 5 days after surgery.  You may leave the incision open to air.  You may have skin tapes (Steri Strips) covering the incision(s).  Leave them on until one week, then remove.  You may replace a dressing/Band-Aid to cover the incision for comfort if you wish.      7. ACTIVITIES as tolerated:   a. You may resume regular (light) daily activities beginning the next day-such as daily self-care, walking, climbing stairs-gradually increasing activities as tolerated.  If you can walk 30 minutes without difficulty, it is safe to try more intense activity such as jogging, treadmill, bicycling, low-impact aerobics, swimming, etc. b. Save the most intensive and strenuous activity for last such as sit-ups, heavy lifting, contact sports, etc  Refrain from any heavy lifting or straining until you   are off narcotics for pain control.   c. DO NOT PUSH THROUGH PAIN.  Let pain be your guide: If it hurts to do something, don't do it.  Pain is your body warning you to avoid that activity for another week until the pain goes down. d. You may drive when you are no longer taking prescription pain medication, you can comfortably wear a seatbelt, and you can safely maneuver your car and apply  brakes. e. You may have sexual intercourse when it is comfortable.  8. FOLLOW UP in our office a. Please call [facility] at [phone] to set up an appointment to see your surgeon in the office for a follow-up appointment approximately 2-3 weeks after your surgery. b. Make sure that you call for this appointment the day you arrive home to insure a convenient appointment time. 9. IF YOU HAVE DISABILITY OR FAMILY LEAVE FORMS, BRING THEM TO THE OFFICE FOR PROCESSING.  DO NOT GIVE THEM TO YOUR DOCTOR.   WHEN TO CALL US [phone]: 1. Poor pain control 2. Reactions / problems with new medications (rash/itching, nausea, etc)  3. Fever over 101.5 F (38.5 C) 4. Worsening swelling or bruising 5. Continued bleeding from incision. 6. Increased pain, redness, or drainage from the incision   The clinic staff is available to answer your questions during regular business hours (8:30am-5pm).  Please don't hesitate to call and ask to speak to one of our nurses for clinical concerns.   If you have a medical emergency, go to the nearest emergency room or call 911.  A surgeon from [facility] is always on call at the hospitals   [facility] [address], [address], [place], [place]  [address] ? MAIN: [phone] ? TOLL FREE: [phone] ?  FAX [phone] [email or web address]  

## 2012-04-19 NOTE — Progress Notes (Signed)
Subjective:     Patient ID: Jaime Riggs, female   DOB: 03-02-1981, 31 y.o.   MRN: [id number]  HPI   Jaime Riggs  05/15/1981 [id number]  Patient Care Team: Jearld Lesch as PCP - General (Specialist)  This patient is a 31 y.o.female who presents today for surgical evaluation.   Procedure: Removal of lipomatous neck mass 04/04/2012  Pathology: Benign fatty fibroadipose tissue. Consistent with lipoma.  Patient comes today with her significant other. Drainage is more serous. Output low. Pain down. Using her neck fine. Energy level better. Wanted to be more active. Not showering much.  Patient Active Problem List  Diagnoses  . Lipoma of posterior neck/upper back 12x9x6cm  . Obesity, Class III, BMI 40-49.9 (morbid obesity)    Past Medical History  Diagnosis Date  . Migraines   . Neck mass   . Sleep apnea   . Epilepsy   . Obesity (BMI 30-39.9) 03/27/2012    Past Surgical History  Procedure Date  . Cesarean section   . Mouth surgery   . Neck mass excision 04/04/2012    pathology c/w lipoma    History   Social History  . Marital Status: Married    Spouse Name: N/A    Number of Children: N/A  . Years of Education: N/A   Occupational History  . Not on file.   Social History Main Topics  . Smoking status: Never Smoker   . Smokeless tobacco: Not on file  . Alcohol Use: No  . Drug Use: No  . Sexually Active: Not on file   Other Topics Concern  . Not on file   Social History Narrative  . No narrative on file    Family History  Problem Relation Age of Onset  . Cancer Maternal Aunt     unknown  . Cancer Maternal Grandfather     lung  . Cancer Paternal Grandmother     ovarian    No current outpatient prescriptions on file.     No Known Allergies  BP 127/81  Pulse 85  Temp(Src) 98.6 F (37 C) (Temporal)  Ht 5\' 6"  (1.676 m)  Wt 275 lb 3.2 oz (124.83 kg)  BMI 44.42 kg/m2  SpO2 90%     Review of Systems  Constitutional: Negative for  fever, chills and diaphoresis.  HENT: Negative for ear pain, sore throat, facial swelling, trouble swallowing, neck pain and neck stiffness.   Eyes: Negative for photophobia and visual disturbance.  Respiratory: Negative for cough and choking.   Cardiovascular: Negative for chest pain and palpitations.  Gastrointestinal: Negative for nausea, vomiting, abdominal pain, diarrhea, constipation, anal bleeding and rectal pain.  Genitourinary: Negative for dysuria, frequency and difficulty urinating.  Musculoskeletal: Negative for myalgias and gait problem.  Skin: Negative for color change, pallor, rash and wound.  Neurological: Negative for dizziness, speech difficulty, weakness and numbness.  Hematological: Negative for adenopathy.  Psychiatric/Behavioral: Negative for confusion and agitation. The patient is not nervous/anxious.        Objective:   Physical Exam  Constitutional: She is oriented to person, place, and time. She appears well-developed and well-nourished. No distress.  HENT:  Head: Normocephalic.  Mouth/Throat: Oropharynx is clear and moist. No oropharyngeal exudate.  Eyes: Conjunctivae and EOM are normal. Pupils are equal, round, and reactive to light. No scleral icterus.  Neck: Normal range of motion. No tracheal deviation present.    Cardiovascular: Normal rate and intact distal pulses.   Pulmonary/Chest: Effort normal. No respiratory distress.  She exhibits no tenderness.  Abdominal: Soft. She exhibits no distension. There is no tenderness. Hernia confirmed negative in the right inguinal area and confirmed negative in the left inguinal area.       Incisions clean with normal healing ridges.  No hernias  Genitourinary: No vaginal discharge found.  Musculoskeletal: Normal range of motion. She exhibits no tenderness.  Lymphadenopathy:       Right: No inguinal adenopathy present.       Left: No inguinal adenopathy present.  Neurological: She is alert and oriented to person,  place, and time. No cranial nerve deficit. She exhibits normal muscle tone. Coordination normal.  Skin: Skin is warm and dry. No rash noted. She is not diaphoretic.  Psychiatric: She has a normal mood and affect. Her behavior is normal.       Assessment:     POD 2 weeks s/p excision of large neck/back lipoma.  Recovering OK    Plan:     Increase activity as tolerated.  Do not push through pain.  Return to clinic ~2 weeks for final check.  The patient & significant other expressed understanding and appreciation

## 2012-05-08 ENCOUNTER — Ambulatory Visit (HOSPITAL_COMMUNITY)
Admission: RE | Admit: 2012-05-08 | Discharge: 2012-05-08 | Disposition: A | Discharge status: Home / Self Care | Payer: Medicaid Other | Source: Ambulatory Visit | Attending: Physician Assistant | Admitting: Physician Assistant

## 2012-05-08 ENCOUNTER — Other Ambulatory Visit (HOSPITAL_COMMUNITY): Payer: Self-pay | Admitting: Physician Assistant

## 2012-05-08 ENCOUNTER — Encounter (HOSPITAL_COMMUNITY): Payer: Self-pay | Admitting: *Deleted

## 2012-05-08 ENCOUNTER — Inpatient Hospital Stay (HOSPITAL_COMMUNITY)
Admission: AD | Admit: 2012-05-08 | Discharge: 2012-05-08 | Disposition: A | Discharge status: Home / Self Care | Payer: Medicaid Other | Source: Ambulatory Visit | Attending: Obstetrics & Gynecology | Admitting: Obstetrics & Gynecology

## 2012-05-08 DIAGNOSIS — O210 Mild hyperemesis gravidarum: Secondary | ICD-10-CM | POA: Insufficient documentation

## 2012-05-08 DIAGNOSIS — R109 Unspecified abdominal pain: Secondary | ICD-10-CM | POA: Insufficient documentation

## 2012-05-08 DIAGNOSIS — O219 Vomiting of pregnancy, unspecified: Secondary | ICD-10-CM

## 2012-05-08 LAB — URINALYSIS, ROUTINE W REFLEX MICROSCOPIC
Bilirubin Urine: NEGATIVE
Ketones, ur: NEGATIVE mg/dL
Nitrite: NEGATIVE
Urobilinogen, UA: 0.2 mg/dL (ref 0.0–1.0)
pH: 7 (ref 5.0–8.0)

## 2012-05-08 LAB — URINE MICROSCOPIC-ADD ON

## 2012-05-08 MED ORDER — PROMETHAZINE HCL 25 MG PO TABS: 25.0000 mg | ORAL_TABLET | Freq: Four times a day (QID) | ORAL | Status: DC | PRN

## 2012-05-08 MED ORDER — ONDANSETRON 8 MG PO TBDP: 8.0000 mg | ORAL_TABLET | Freq: Three times a day (TID) | ORAL | Status: AC | PRN

## 2012-05-08 NOTE — Discharge Instructions (Signed)
Morning Sickness Morning sickness is when you feel sick to your stomach (nauseous) during pregnancy. You may feel sick to your stomach and throw up (vomit). You may feel sick in the morning, but you can feel this way any time of day. Some women feel very sick to their stomach and cannot stop throwing up (hyperemesis gravidarum). HOME CARE  Take multivitamins as told by your doctor. Taking multivitamins before getting pregnant can stop or lessen the harshness of morning sickness.   Eat dry toast or unsalted crackers before getting out of bed.   Eat 5 to 6 small meals a day.   Eat dry and bland foods like rice and baked potatoes.   Do not drink liquids with meals. Drink between meals.   Do not eat greasy, fatty, or spicy foods.   Have someone cook for you if the smell of food causes you to feel sick or throw up.   Do not take vitamins with iron, or as told by your doctor.   Eat protein when you need a snack (nuts, yogurt, cheese).   Eat unsweetened gelatins for dessert.   Wear a bracelet used for sea sickness (acupressure wristband).   Go to a doctor that puts thin needles into certain body points (acupuncture) to improve how you feel.   Do not smoke.   Use a humidifier to keep the air in your house free of odors.  GET HELP RIGHT AWAY IF:   You feel very sick to your stomach and cannot stop throwing up.   You pass out (faint).   You have a fever.   You need medicine to feel better.   You feel dizzy or lightheaded.   You are losing weight.   You need help knowing what to eat and what not to eat.  MAKE SURE YOU:   Understand these instructions.   Will watch your condition.   Will get help right away if you are not doing well or get worse.  Document Released: 01/13/2005 Document Revised: 11/25/2011 Document Reviewed: 03/05/2010 [employer] Patient Information 2012 [employer]. 

## 2012-05-08 NOTE — MAU Note (Signed)
Patient states she had a mass removed from the back of the neck in April. States she has been having some nausea and vomiting and abdominal pain for about one week. Went to her family MD and had a positive pregnancy test. Pain is all in the upper abdomen.

## 2012-05-10 ENCOUNTER — Encounter (INDEPENDENT_AMBULATORY_CARE_PROVIDER_SITE_OTHER): Payer: Medicaid Other | Admitting: Surgery

## 2012-06-01 ENCOUNTER — Other Ambulatory Visit: Payer: Self-pay

## 2012-06-07 ENCOUNTER — Inpatient Hospital Stay (HOSPITAL_COMMUNITY)
Admission: AD | Admit: 2012-06-07 | Discharge: 2012-06-07 | Disposition: A | Discharge status: Home / Self Care | Payer: Medicaid Other | Source: Ambulatory Visit | Attending: Obstetrics and Gynecology | Admitting: Obstetrics and Gynecology

## 2012-06-07 DIAGNOSIS — O99891 Other specified diseases and conditions complicating pregnancy: Secondary | ICD-10-CM | POA: Insufficient documentation

## 2012-06-07 DIAGNOSIS — O210 Mild hyperemesis gravidarum: Secondary | ICD-10-CM | POA: Insufficient documentation

## 2012-06-07 DIAGNOSIS — K5900 Constipation, unspecified: Secondary | ICD-10-CM | POA: Insufficient documentation

## 2012-06-07 MED ORDER — FLEET ENEMA 7-19 GM/118ML RE ENEM: 1.0000 | ENEMA | Freq: Once | RECTAL | Status: DC

## 2012-06-07 NOTE — MAU Note (Signed)
Patient has not had a bowel movement for 1 week, has tried Miralax, eaten prunes, MOM has not tried an enema patient is 12 weeks abdominal pain.

## 2012-06-07 NOTE — MAU Note (Signed)
Patient desires to leave and give herself a fleets enema at home.

## 2012-06-07 NOTE — MAU Note (Signed)
12.6wk Twins.  Severe constipation.  Last BM was 1 wk ago, but was only a small, constipated amt.  Has been using Miralax & MOM at home, also eating prunes.  Has good appetite, but is unable to eat due to nausea.

## 2012-06-07 NOTE — MAU Note (Signed)
Patient is pregnant with twins.  

## 2012-06-07 NOTE — MAU Provider Note (Signed)
History     CSN: [id number]  Arrival date and time: 06/07/12 1207   First Provider Initiated Contact with Patient 06/07/12 1403      Chief Complaint  Patient presents with  . Constipation    Constipation - last BM 1wk ago (only small amt) has tried miralax, prunes, MOM,    HPIKimberly IRJA Riggs is 31 y.o. G2P1001 [redacted]w[redacted]d weeks presenting with constipation.  She is a patient of Dr. Berenda Morale.   Last bowel movement 1 week ago only because she vomiting and had BM at same time.  States she is able to drink water but unable to eat.  Last ate last night "couple of noodles".  Thinks she would feel better if she could go to the bathroom.  He is having abdominal pain.  Vomited X 1 today when she tried to eat.  She has tried MOM, Miralax without results.  Using Zofran for nausea.      Past Medical History  Diagnosis Date  . Migraines   . Neck mass   . Sleep apnea   . Epilepsy   . Obesity (BMI 30-39.9) 03/27/2012    Past Surgical History  Procedure Date  . Cesarean section   . Mouth surgery   . Neck mass excision 04/04/2012    pathology c/w lipoma    Family History  Problem Relation Age of Onset  . Cancer Maternal Aunt     unknown  . Cancer Maternal Grandfather     lung  . Cancer Paternal Grandmother     ovarian    History  Substance Use Topics  . Smoking status: Never Smoker   . Smokeless tobacco: Not on file  . Alcohol Use: No    Allergies: No Known Allergies  Prescriptions prior to admission  Medication Sig Dispense Refill  . polyethylene glycol (MIRALAX / GLYCOLAX) packet Take 17 g by mouth daily.      . Prenatal Vit-Fe Fumarate-FA (PRENATAL MULTIVITAMIN) TABS Take 1 tablet by mouth daily.      . promethazine (PHENERGAN) 25 MG tablet Take 1 tablet (25 mg total) by mouth every 6 (six) hours as needed for nausea.  30 tablet  0    Review of Systems  Constitutional: Negative for fever and chills.  Gastrointestinal: Positive for nausea, vomiting, abdominal pain and  constipation.  Genitourinary:       Negative for vaginal bleeding or loss of fluid.    Physical Exam   Blood pressure 125/70, pulse 93, temperature 98.8 F (37.1 C), temperature source Oral, resp. rate 20, height 5\' 6"  (1.676 m), last menstrual period 03/09/2012.  Physical Exam  Constitutional: She is oriented to person, place, and time. She appears well-developed and well-nourished. Distressed: uncomfortable.  Respiratory: Effort normal.  GI: Soft. She exhibits no distension and no mass. There is tenderness. There is no rebound and no guarding.  Genitourinary:       Not indicated  Neurological: She is alert and oriented to person, place, and time.  Skin: Skin is warm and dry.  Psychiatric: She has a normal mood and affect. Her behavior is normal.    MAU Course  Procedures  MDM 14:58  Reported MSE to Dr. Jackelyn Knife.  Order given for Fleet's enema RN reported that patient was instructed on the use of a fleet's enema and signed out AMA because she wanted to use one at home where there was more privacy.  She left at approximately 16:10 Assessment and Plan  A:  Constipation at [redacted]w[redacted]d gestation  Nausea  P:  Left AMA  Brinda Focht,EVE M 06/07/2012, 2:05 PM

## 2012-06-12 ENCOUNTER — Other Ambulatory Visit (HOSPITAL_COMMUNITY): Payer: Self-pay | Admitting: Obstetrics and Gynecology

## 2012-06-12 DIAGNOSIS — IMO0001 Reserved for inherently not codable concepts without codable children: Secondary | ICD-10-CM

## 2012-06-12 DIAGNOSIS — Z3682 Encounter for antenatal screening for nuchal translucency: Secondary | ICD-10-CM

## 2012-06-16 LAB — OB RESULTS CONSOLE HIV ANTIBODY (ROUTINE TESTING): HIV: NONREACTIVE

## 2012-06-16 LAB — OB RESULTS CONSOLE ABO/RH

## 2012-06-20 ENCOUNTER — Encounter (HOSPITAL_COMMUNITY): Payer: Self-pay

## 2012-06-20 ENCOUNTER — Ambulatory Visit (HOSPITAL_COMMUNITY)
Admission: RE | Admit: 2012-06-20 | Discharge: 2012-06-20 | Disposition: A | Discharge status: Home / Self Care | Payer: Medicaid Other | Source: Ambulatory Visit | Attending: Obstetrics and Gynecology | Admitting: Obstetrics and Gynecology

## 2012-06-20 ENCOUNTER — Other Ambulatory Visit: Payer: Self-pay

## 2012-06-20 DIAGNOSIS — IMO0001 Reserved for inherently not codable concepts without codable children: Secondary | ICD-10-CM

## 2012-06-20 DIAGNOSIS — O3510X0 Maternal care for (suspected) chromosomal abnormality in fetus, unspecified, not applicable or unspecified: Secondary | ICD-10-CM | POA: Insufficient documentation

## 2012-06-20 DIAGNOSIS — O34219 Maternal care for unspecified type scar from previous cesarean delivery: Secondary | ICD-10-CM | POA: Insufficient documentation

## 2012-06-20 DIAGNOSIS — O30009 Twin pregnancy, unspecified number of placenta and unspecified number of amniotic sacs, unspecified trimester: Secondary | ICD-10-CM | POA: Insufficient documentation

## 2012-06-20 DIAGNOSIS — Z3689 Encounter for other specified antenatal screening: Secondary | ICD-10-CM | POA: Insufficient documentation

## 2012-06-20 DIAGNOSIS — O351XX0 Maternal care for (suspected) chromosomal abnormality in fetus, not applicable or unspecified: Secondary | ICD-10-CM | POA: Insufficient documentation

## 2012-06-20 DIAGNOSIS — O355XX0 Maternal care for (suspected) damage to fetus by drugs, not applicable or unspecified: Secondary | ICD-10-CM | POA: Insufficient documentation

## 2012-06-20 DIAGNOSIS — Z3682 Encounter for antenatal screening for nuchal translucency: Secondary | ICD-10-CM

## 2012-09-24 ENCOUNTER — Encounter (HOSPITAL_COMMUNITY): Payer: Self-pay | Admitting: *Deleted

## 2012-09-24 ENCOUNTER — Inpatient Hospital Stay (HOSPITAL_COMMUNITY)
Admission: AD | Admit: 2012-09-24 | Discharge: 2012-09-24 | Disposition: A | Discharge status: Home / Self Care | Payer: Medicaid Other | Source: Ambulatory Visit | Attending: Obstetrics and Gynecology | Admitting: Obstetrics and Gynecology

## 2012-09-24 DIAGNOSIS — O26899 Other specified pregnancy related conditions, unspecified trimester: Secondary | ICD-10-CM

## 2012-09-24 DIAGNOSIS — R51 Headache: Secondary | ICD-10-CM

## 2012-09-24 DIAGNOSIS — O99891 Other specified diseases and conditions complicating pregnancy: Secondary | ICD-10-CM | POA: Insufficient documentation

## 2012-09-24 LAB — URINALYSIS, ROUTINE W REFLEX MICROSCOPIC
Glucose, UA: NEGATIVE mg/dL
Protein, ur: NEGATIVE mg/dL
Urobilinogen, UA: 0.2 mg/dL (ref 0.0–1.0)

## 2012-09-24 LAB — URINE MICROSCOPIC-ADD ON

## 2012-09-24 MED ORDER — OXYCODONE-ACETAMINOPHEN 5-325 MG PO TABS: 1.0000 | ORAL_TABLET | ORAL | Status: DC | PRN

## 2012-09-24 MED ORDER — OXYCODONE-ACETAMINOPHEN 5-325 MG PO TABS
2.0000 | ORAL_TABLET | Freq: Once | ORAL | Status: AC
  Administered 2012-09-24: 2 via ORAL
  Filled 2012-09-24: qty 2

## 2012-09-24 NOTE — MAU Note (Signed)
Pt reports a migraine h/a since yesterday. Pt reports n/v yesterday, but took zofran this morning and has not been sick today.

## 2012-09-24 NOTE — MAU Provider Note (Signed)
History     CSN: [id number]  Arrival date and time: 09/24/12 1140   None     Chief Complaint  Patient presents with  . Headache  . Dizziness  . Fatigue   HPI 31 y.o. G2P1001 at [redacted]w[redacted]d, migraine x 1 day, seeing spots with standing, + vomiting yesterday. Unable to sleep d/t headache and dizziness. Took some Tylenol at 5 AM with no relief. + fetal movement.    Past Medical History  Diagnosis Date  . Migraines   . Neck mass   . Sleep apnea   . Epilepsy   . Obesity (BMI 30-39.9) 03/27/2012    Past Surgical History  Procedure Date  . Cesarean section   . Mouth surgery   . Neck mass excision 04/04/2012    pathology c/w lipoma    Family History  Problem Relation Age of Onset  . Cancer Maternal Aunt     unknown  . Cancer Maternal Grandfather     lung  . Cancer Paternal Grandmother     ovarian  . Other Neg Hx     History  Substance Use Topics  . Smoking status: Never Smoker   . Smokeless tobacco: Not on file  . Alcohol Use: No    Allergies: No Known Allergies  Prescriptions prior to admission  Medication Sig Dispense Refill  . polyethylene glycol (MIRALAX / GLYCOLAX) packet Take 17 g by mouth daily.      . Prenatal Vit-Fe Fumarate-FA (PRENATAL MULTIVITAMIN) TABS Take 1 tablet by mouth daily.      . promethazine (PHENERGAN) 25 MG tablet Take 1 tablet (25 mg total) by mouth every 6 (six) hours as needed for nausea.  30 tablet  0    Review of Systems  Constitutional: Negative.   Eyes: Negative for blurred vision and double vision.  Respiratory: Negative.   Cardiovascular: Negative.   Gastrointestinal: Positive for nausea. Negative for vomiting, abdominal pain, diarrhea and constipation.  Genitourinary: Negative for dysuria, urgency, frequency, hematuria and flank pain.       Negative for vaginal bleeding, cramping/contractions  Musculoskeletal: Negative.   Neurological: Positive for dizziness and headaches.  Psychiatric/Behavioral: Negative.    Physical Exam    Blood pressure 121/83, pulse 125, temperature 99.9 F (37.7 C), temperature source Oral, resp. rate 20, last menstrual period 03/09/2012.  Physical Exam  Nursing note and vitals reviewed. Constitutional: She is oriented to person, place, and time. She appears well-developed and well-nourished. No distress.  Cardiovascular: Normal rate.   Respiratory: Effort normal.  GI: Soft. There is no tenderness.  Musculoskeletal: Normal range of motion.  Neurological: She is alert and oriented to person, place, and time. She has normal reflexes.  Skin: Skin is warm and dry.  Psychiatric: She has a normal mood and affect.   FHR reactive  MAU Course  Procedures Results for orders placed during the hospital encounter of 09/24/12 (from the past 48 hour(s))  URINALYSIS, ROUTINE W REFLEX MICROSCOPIC     Status: Abnormal   Collection Time   09/24/12 11:40 AM      Component Value Range Comment   Color, Urine YELLOW  YELLOW    APPearance CLOUDY (*) CLEAR    Specific Gravity, Urine >1.030 (*) 1.005 - 1.030    pH 6.5  5.0 - 8.0    Glucose, UA NEGATIVE  NEGATIVE mg/dL    Hgb urine dipstick NEGATIVE  NEGATIVE    Bilirubin Urine NEGATIVE  NEGATIVE    Ketones, ur 15 (*) NEGATIVE mg/dL  Protein, ur NEGATIVE  NEGATIVE mg/dL    Urobilinogen, UA 0.2  0.0 - 1.0 mg/dL    Nitrite NEGATIVE  NEGATIVE    Leukocytes, UA SMALL (*) NEGATIVE   URINE MICROSCOPIC-ADD ON     Status: Abnormal   Collection Time   09/24/12 11:40 AM      Component Value Range Comment   Squamous Epithelial / LPF MANY (*) RARE    WBC, UA 7-10  <3 WBC/hpf    RBC / HPF 0-2  <3 RBC/hpf    Bacteria, UA MANY (*) RARE    Headache improved with percocet   Assessment and Plan   1. Headache in pregnancy       Medication List     As of 09/25/2012  6:59 PM    START taking these medications         oxyCODONE-acetaminophen 5-325 MG per tablet   Commonly known as: PERCOCET/ROXICET   Take 1 tablet by mouth every 4 (four) hours as  needed for pain.      CONTINUE taking these medications         acetaminophen 500 MG tablet   Commonly known as: TYLENOL      FAMOTIDINE PO      flintstones complete 60 MG chewable tablet      ZOFRAN PO          Where to get your medications    These are the prescriptions that you need to pick up.   You may get these medications from any pharmacy.         oxyCODONE-acetaminophen 5-325 MG per tablet            Follow-up Information    Follow up with MEISINGER,TODD D, MD. (as scheduled)    Contact information:   [address] [place] [address] [phone]            [facility] 09/24/2012, 12:18 PM

## 2012-10-17 ENCOUNTER — Inpatient Hospital Stay (HOSPITAL_COMMUNITY)
Admission: AD | Admit: 2012-10-17 | Discharge: 2012-10-17 | Disposition: A | Discharge status: Home / Self Care | Payer: Medicaid Other | Source: Ambulatory Visit | Attending: Obstetrics and Gynecology | Admitting: Obstetrics and Gynecology

## 2012-10-17 ENCOUNTER — Encounter (HOSPITAL_COMMUNITY): Payer: Self-pay

## 2012-10-17 DIAGNOSIS — R0602 Shortness of breath: Secondary | ICD-10-CM | POA: Insufficient documentation

## 2012-10-17 DIAGNOSIS — J069 Acute upper respiratory infection, unspecified: Secondary | ICD-10-CM | POA: Insufficient documentation

## 2012-10-17 DIAGNOSIS — G4730 Sleep apnea, unspecified: Secondary | ICD-10-CM | POA: Insufficient documentation

## 2012-10-17 DIAGNOSIS — O99891 Other specified diseases and conditions complicating pregnancy: Secondary | ICD-10-CM | POA: Insufficient documentation

## 2012-10-17 LAB — URINALYSIS, ROUTINE W REFLEX MICROSCOPIC
Bilirubin Urine: NEGATIVE
Glucose, UA: NEGATIVE mg/dL
Hgb urine dipstick: NEGATIVE
Ketones, ur: 40 mg/dL — AB
pH: 7 (ref 5.0–8.0)

## 2012-10-17 MED ORDER — DM-GUAIFENESIN ER 30-600 MG PO TB12: 1.0000 | ORAL_TABLET | Freq: Two times a day (BID) | ORAL | Status: DC

## 2012-10-17 NOTE — MAU Provider Note (Signed)
History     CSN: [id number]  Arrival date and time: 10/17/12 1524   First Provider Initiated Contact with Patient 10/17/12 1556      Chief Complaint  Patient presents with  . Shortness of Breath  . Nasal Congestion   HPI This is a 30 y.o. female who presents with c/o shortness of breath since last night. Has 4 yr hx of diagnosed sleep apnea.  Has been without her CPAP for a while because it broke. States it starts at night when she sleeps lying down, wakes up gasping. Has to sleep sitting up. Also has intermittent earache and nasal congestion, none now. Does had a productive cough.   OB History    Grav Para Term Preterm Abortions TAB SAB Ect Mult Living   2 1 1       1       Past Medical History  Diagnosis Date  . Migraines   . Neck mass   . Sleep apnea   . Epilepsy   . Obesity (BMI 30-39.9) 03/27/2012    Past Surgical History  Procedure Date  . Cesarean section   . Mouth surgery   . Neck mass excision 04/04/2012    pathology c/w lipoma    Family History  Problem Relation Age of Onset  . Cancer Maternal Aunt     unknown  . Cancer Maternal Grandfather     lung  . Cancer Paternal Grandmother     ovarian  . Other Neg Hx     History  Substance Use Topics  . Smoking status: Never Smoker   . Smokeless tobacco: Not on file  . Alcohol Use: No    Allergies: No Known Allergies  Prescriptions prior to admission  Medication Sig Dispense Refill  . acetaminophen (TYLENOL) 500 MG tablet Take 1,000 mg by mouth every 6 (six) hours as needed.      Marland Kitchen FAMOTIDINE PO Take 1 tablet by mouth daily.      . flintstones complete (FLINTSTONES) 60 MG chewable tablet Chew 1 tablet by mouth daily.      . Ondansetron HCl (ZOFRAN PO) Take 1 tablet by mouth daily as needed. Takes for nausea      . oxyCODONE-acetaminophen (PERCOCET/ROXICET) 5-325 MG per tablet Take 1 tablet by mouth every 4 (four) hours as needed for pain.  15 tablet  0    ROS See HPI  Physical Exam   Last  menstrual period 03/09/2012, SpO2 99.00%.  Physical Exam  Constitutional: She is oriented to person, place, and time. She appears well-developed and well-nourished. No distress (does not appear dyspneic).  Cardiovascular: Normal rate, regular rhythm and normal heart sounds.   Respiratory: Effort normal. No respiratory distress. She has no wheezes. She has no rales. She exhibits no tenderness.       O2 sat 98-100%  GI: Soft. She exhibits no distension. There is no tenderness. There is no rebound.       FHR reassuring, difficult to trace twin B No contractions.   Musculoskeletal: Normal range of motion.  Neurological: She is alert and oriented to person, place, and time.  Skin: Skin is warm and dry.  Psychiatric: She has a normal mood and affect.    MAU Course  Procedures  Assessment and Plan  A:  Twin IUP at [redacted]w[redacted]d        URI       Sleep Apnea  P:  Discussed with Dr Jackelyn Knife      Will treat conservatively  Rx Mucinex       Pt to call office to arrange release of records for sleep study (done 4 yrs ago, they refuse to release due to balance due) so that she can get a new CPAP machine         [facility] 10/17/2012, 4:27 PM

## 2012-10-17 NOTE — MAU Note (Signed)
Twin gestation, c/o shortness since last night, congestion

## 2012-10-18 NOTE — Progress Notes (Signed)
FHT from 10-29 reviewed.  Baby A with reactive NST, no significant decels.  Baby B is reassuring, not on the monitor enough for reactive NST, no decels when it is on the monitor, no ctx.

## 2012-11-28 ENCOUNTER — Encounter (HOSPITAL_COMMUNITY): Payer: Self-pay | Admitting: Pharmacist

## 2012-12-01 ENCOUNTER — Encounter (HOSPITAL_COMMUNITY): Payer: Self-pay

## 2012-12-01 ENCOUNTER — Encounter (HOSPITAL_COMMUNITY)
Admission: RE | Admit: 2012-12-01 | Discharge: 2012-12-01 | Disposition: A | Discharge status: Home / Self Care | Payer: Medicaid Other | Source: Ambulatory Visit | Attending: Obstetrics and Gynecology | Admitting: Obstetrics and Gynecology

## 2012-12-01 HISTORY — DX: Shortness of breath: R06.02

## 2012-12-01 HISTORY — DX: Gastro-esophageal reflux disease without esophagitis: K21.9

## 2012-12-01 LAB — CBC
HCT: 25.3 % — ABNORMAL LOW (ref 36.0–46.0)
Hemoglobin: 7.5 g/dL — ABNORMAL LOW (ref 12.0–15.0)
MCH: 19.1 pg — ABNORMAL LOW (ref 26.0–34.0)
MCHC: 29.6 g/dL — ABNORMAL LOW (ref 30.0–36.0)

## 2012-12-01 LAB — SURGICAL PCR SCREEN: Staphylococcus aureus: NEGATIVE

## 2012-12-01 NOTE — Pre-Procedure Instructions (Signed)
Attempted to call Hgb result to Dr Senaida Ores, office phone on voice mail only, message left.

## 2012-12-01 NOTE — Pre-Procedure Instructions (Signed)
Message left with voice mail Bonita Quin) for Dr Senaida Ores due to Hgb 7.5

## 2012-12-01 NOTE — Pre-Procedure Instructions (Signed)
Call rec'd from Linda/MD office, she will notify Dr Senaida Ores of Hgb ASAP.

## 2012-12-01 NOTE — Patient Instructions (Addendum)
20 Jaime Riggs  12/01/2012   Your procedure is scheduled on:  12/04/12  Enter through the Main Entrance of [facility] at 6 AM.  Pick up the phone at the desk and dial [phone].   Call this number if you have problems the morning of surgery: [phone]   Remember:   Do not eat food:After Midnight.  Do not drink clear liquids: After Midnight.  Take these medicines the morning of surgery with A SIP OF WATER: May take Zofran. Take Zantac   Do not wear jewelry, make-up or nail polish.  Do not wear lotions, powders, or perfumes. You may wear deodorant.  Do not shave 48 hours prior to surgery.  Do not bring valuables to the hospital.  Contacts, dentures or bridgework may not be worn into surgery.  Leave suitcase in the car. After surgery it may be brought to your room.  For patients admitted to the hospital, checkout time is 11:00 AM the day of discharge.   Patients discharged the day of surgery will not be allowed to drive home.  Name and phone number of your driver: NA  Special Instructions: Shower using CHG 2 nights before surgery and the night before surgery.  If you shower the day of surgery use CHG.  Use special wash - you have one bottle of CHG for all showers.  You should use approximately 1/3 of the bottle for each shower.   Please read over the following fact sheets that you were given: MRSA Information

## 2012-12-03 NOTE — H&P (Signed)
Jaime Riggs is a 31 y.o. female G2P1001 at 37+ weeks (EDD 12/25/12 by a 7 week [place]) with a diamniotic/dichorionic twin pregnacy  presenting for scheduled repeat c-section. Her prenatal care has been relatively uncomplicated with concordant growth of the twins noted on serial [place].  She is GBS positive.  She has a remote h/o of epilepsy but has had only 5 seizures in her life and is on no medications for this.  She is chronically anemic but has a normal hemoglobin electrophoreseis. The twins are currently breech/vertex.  History OB History    Grav Para Term Preterm Abortions TAB SAB Ect Mult Living   2 1 1       1     LTCS 2010 8#7oz Arrest of descent  Past Medical History  Diagnosis Date  . Migraines   . Neck mass   . Sleep apnea   . Obesity (BMI 30-39.9) 03/27/2012  . Epilepsy     last episode  two yrs.  Marland Kitchen GERD (gastroesophageal reflux disease)   . Shortness of breath     with preg with multiple only   Past Surgical History  Procedure Date  . Cesarean section   . Mouth surgery   . Neck mass excision 04/04/2012    pathology c/w lipoma   Family History: family history includes Cancer in her maternal aunt, maternal grandfather, and paternal grandmother.  There is no history of Other. Social History:  reports that she has never smoked. She does not have any smokeless tobacco history on file. She reports that she does not drink alcohol or use illicit drugs.   Prenatal Transfer Tool  Maternal Diabetes: No Genetic Screening: Normal Maternal Ultrasounds/Referrals: Normal  Isolated EIF on twin A Fetal Ultrasounds or other Referrals:  None Maternal Substance Abuse:  No Significant Maternal Medications:  None Significant Maternal Lab Results:  Lab values include: Group B Strep positive, Other: Hgb 7.5 Other Comments:  None  ROS    Last menstrual period 03/09/2012. Maternal Exam:  Uterine Assessment: Contraction strength is mild.  Contraction frequency is rare.   Abdomen: Patient  reports no abdominal tenderness. Introitus: Normal vulva. Normal vagina.    Physical Exam  Constitutional: She is oriented to person, place, and time. She appears well-developed and well-nourished.  Cardiovascular: Normal rate and regular rhythm.   Respiratory: Effort normal and breath sounds normal.  GI: Soft.  Genitourinary: Vagina normal.       Uterus gravid with FH of 50 cm  Neurological: She is alert and oriented to person, place, and time.  Psychiatric: She has a normal mood and affect. Her behavior is normal.    Prenatal labs: ABO, Rh: --/--/O POS (12/13 1130) Antibody: NEG (12/13 1125) Rubella: Immune (06/28 1216) RPR: NON REACTIVE (12/13 1130)  HBsAg: Negative (06/28 1216)  HIV: Non-reactive (06/28 1216)  GBS:   positive One hour GTT 109 First trimester screen negative for Down's and AFP negative Hgb AA Assessment/Plan: Pt for scheduled repeat c-section given prior c-section and current twin gestation with breech/vertex lie.  She is chronically anemic but does not tolerate oral iron therapy well so has not taken it as advised.  Her hgb at the outset of the pregnancy was 8.2 and currently 7.5.  I have d/w her that with twins and increased risk of uterine atony she is at much higher risk of needing transfusion and she understands.  We also discussed the risks of infection and damage to bowel and bladder.  She declines a BTL.  Wrenley Sayed W 12/03/2012, 8:00 AM

## 2012-12-04 ENCOUNTER — Inpatient Hospital Stay (HOSPITAL_COMMUNITY)
Admission: RE | Admit: 2012-12-04 | Discharge: 2012-12-07 | DRG: 765 | Disposition: A | Discharge status: Home / Self Care | Payer: Medicaid Other | Source: Ambulatory Visit | Attending: Obstetrics and Gynecology | Admitting: Obstetrics and Gynecology

## 2012-12-04 ENCOUNTER — Encounter (HOSPITAL_COMMUNITY): Payer: Self-pay | Admitting: Anesthesiology

## 2012-12-04 ENCOUNTER — Encounter (HOSPITAL_COMMUNITY): Payer: Self-pay | Admitting: *Deleted

## 2012-12-04 ENCOUNTER — Encounter (HOSPITAL_COMMUNITY)
Admission: RE | Disposition: A | Discharge status: Home / Self Care | Payer: Self-pay | Source: Ambulatory Visit | Attending: Obstetrics and Gynecology

## 2012-12-04 ENCOUNTER — Inpatient Hospital Stay (HOSPITAL_COMMUNITY): Payer: Medicaid Other | Admitting: Anesthesiology

## 2012-12-04 DIAGNOSIS — D638 Anemia in other chronic diseases classified elsewhere: Secondary | ICD-10-CM | POA: Diagnosis present

## 2012-12-04 DIAGNOSIS — Z22330 Carrier of Group B streptococcus: Secondary | ICD-10-CM

## 2012-12-04 DIAGNOSIS — O9902 Anemia complicating childbirth: Secondary | ICD-10-CM | POA: Diagnosis present

## 2012-12-04 DIAGNOSIS — O99892 Other specified diseases and conditions complicating childbirth: Secondary | ICD-10-CM | POA: Diagnosis present

## 2012-12-04 DIAGNOSIS — O30049 Twin pregnancy, dichorionic/diamniotic, unspecified trimester: Secondary | ICD-10-CM

## 2012-12-04 DIAGNOSIS — O34219 Maternal care for unspecified type scar from previous cesarean delivery: Principal | ICD-10-CM | POA: Diagnosis present

## 2012-12-04 DIAGNOSIS — O30009 Twin pregnancy, unspecified number of placenta and unspecified number of amniotic sacs, unspecified trimester: Secondary | ICD-10-CM | POA: Diagnosis present

## 2012-12-04 DIAGNOSIS — O3090 Multiple gestation, unspecified, unspecified trimester: Secondary | ICD-10-CM | POA: Diagnosis present

## 2012-12-04 LAB — URINALYSIS, ROUTINE W REFLEX MICROSCOPIC
Bilirubin Urine: NEGATIVE
Hgb urine dipstick: NEGATIVE
Nitrite: NEGATIVE
Specific Gravity, Urine: >1.03 — ABNORMAL HIGH (ref 1.005–1.030)
Urobilinogen, UA: 0.2 mg/dL (ref 0.0–1.0)
pH: 6 (ref 5.0–8.0)

## 2012-12-04 LAB — TYPE AND SCREEN
Antibody Screen: NEGATIVE
Unit division: 0

## 2012-12-04 LAB — COMPREHENSIVE METABOLIC PANEL
ALT: 8 U/L (ref 0–35)
AST: 22 U/L (ref 0–37)
Albumin: 2.6 g/dL — ABNORMAL LOW (ref 3.5–5.2)
Calcium: 8.6 mg/dL (ref 8.4–10.5)
Creatinine, Ser: 0.52 mg/dL (ref 0.50–1.10)
GFR calc non Af Amer: >90 mL/min (ref >90)
Sodium: 133 mEq/L — ABNORMAL LOW (ref 135–145)
Total Protein: 6 g/dL (ref 6.0–8.3)

## 2012-12-04 LAB — CBC
MCH: 18.7 pg — ABNORMAL LOW (ref 26.0–34.0)
MCHC: 29 g/dL — ABNORMAL LOW (ref 30.0–36.0)
MCV: 64.3 fL — ABNORMAL LOW (ref 78.0–100.0)
Platelets: 169 10*3/uL (ref 150–400)
RBC: 4.23 MIL/uL (ref 3.87–5.11)
RDW: 19.7 % — ABNORMAL HIGH (ref 11.5–15.5)

## 2012-12-04 LAB — URINE MICROSCOPIC-ADD ON

## 2012-12-04 SURGERY — Surgical Case
Anesthesia: Spinal | Site: Abdomen | Wound class: Clean Contaminated

## 2012-12-04 MED ORDER — DIPHENHYDRAMINE HCL 25 MG PO CAPS: 25.0000 mg | ORAL_CAPSULE | Freq: Four times a day (QID) | ORAL | Status: DC | PRN

## 2012-12-04 MED ORDER — ONDANSETRON HCL 4 MG/2ML IJ SOLN
INTRAMUSCULAR | Status: AC
  Filled 2012-12-04: qty 2

## 2012-12-04 MED ORDER — NALBUPHINE HCL 10 MG/ML IJ SOLN
5.0000 mg | INTRAMUSCULAR | Status: DC | PRN
  Filled 2012-12-04 (×2): qty 1

## 2012-12-04 MED ORDER — DIPHENHYDRAMINE HCL 50 MG/ML IJ SOLN: 25.0000 mg | INTRAMUSCULAR | Status: DC | PRN

## 2012-12-04 MED ORDER — DIPHENHYDRAMINE HCL 25 MG PO CAPS: 25.0000 mg | ORAL_CAPSULE | ORAL | Status: DC | PRN

## 2012-12-04 MED ORDER — LABETALOL HCL 5 MG/ML IV SOLN
10.0000 mg | INTRAVENOUS | Status: AC | PRN
  Administered 2012-12-04 (×2): 10 mg via INTRAVENOUS

## 2012-12-04 MED ORDER — NALOXONE HCL 1 MG/ML IJ SOLN: 1.0000 ug/kg/h | INTRAVENOUS | Status: DC | PRN

## 2012-12-04 MED ORDER — WITCH HAZEL-GLYCERIN EX PADS: 1.0000 "application " | MEDICATED_PAD | CUTANEOUS | Status: DC | PRN

## 2012-12-04 MED ORDER — LACTATED RINGERS IV SOLN
INTRAVENOUS | Status: DC | PRN
  Administered 2012-12-04: 08:00:00 via INTRAVENOUS

## 2012-12-04 MED ORDER — KETOROLAC TROMETHAMINE 30 MG/ML IJ SOLN: 30.0000 mg | Freq: Four times a day (QID) | INTRAMUSCULAR | Status: DC | PRN

## 2012-12-04 MED ORDER — MENTHOL 3 MG MT LOZG: 1.0000 | LOZENGE | OROMUCOSAL | Status: DC | PRN

## 2012-12-04 MED ORDER — ONDANSETRON HCL 4 MG/2ML IJ SOLN: 4.0000 mg | Freq: Four times a day (QID) | INTRAMUSCULAR | Status: DC | PRN

## 2012-12-04 MED ORDER — PHENYLEPHRINE 40 MCG/ML (10ML) SYRINGE FOR IV PUSH (FOR BLOOD PRESSURE SUPPORT)
PREFILLED_SYRINGE | INTRAVENOUS | Status: AC
  Filled 2012-12-04: qty 15

## 2012-12-04 MED ORDER — NALOXONE HCL 1 MG/ML IJ SOLN
1.0000 ug/kg/h | INTRAMUSCULAR | Status: DC | PRN
  Filled 2012-12-04: qty 2

## 2012-12-04 MED ORDER — NALOXONE HCL 0.4 MG/ML IJ SOLN: 0.4000 mg | INTRAMUSCULAR | Status: DC | PRN

## 2012-12-04 MED ORDER — DIPHENHYDRAMINE HCL 50 MG/ML IJ SOLN: 12.5000 mg | Freq: Four times a day (QID) | INTRAMUSCULAR | Status: DC | PRN

## 2012-12-04 MED ORDER — DIBUCAINE 1 % RE OINT: 1.0000 "application " | TOPICAL_OINTMENT | RECTAL | Status: DC | PRN

## 2012-12-04 MED ORDER — SCOPOLAMINE 1 MG/3DAYS TD PT72
1.0000 | MEDICATED_PATCH | Freq: Once | TRANSDERMAL | Status: DC
  Filled 2012-12-04: qty 1

## 2012-12-04 MED ORDER — SIMETHICONE 80 MG PO CHEW
80.0000 mg | CHEWABLE_TABLET | Freq: Three times a day (TID) | ORAL | Status: DC
  Administered 2012-12-04 – 2012-12-07 (×13): 80 mg via ORAL

## 2012-12-04 MED ORDER — MEPERIDINE HCL 25 MG/ML IJ SOLN: 6.2500 mg | INTRAMUSCULAR | Status: DC | PRN

## 2012-12-04 MED ORDER — OXYTOCIN 40 UNITS IN LACTATED RINGERS INFUSION - SIMPLE MED: 62.5000 mL/h | INTRAVENOUS | Status: DC

## 2012-12-04 MED ORDER — KETOROLAC TROMETHAMINE 60 MG/2ML IM SOLN: 60.0000 mg | Freq: Once | INTRAMUSCULAR | Status: AC | PRN

## 2012-12-04 MED ORDER — FENTANYL CITRATE 0.05 MG/ML IJ SOLN
INTRAMUSCULAR | Status: DC | PRN
  Administered 2012-12-04: 25 ug via INTRATHECAL

## 2012-12-04 MED ORDER — IBUPROFEN 600 MG PO TABS
600.0000 mg | ORAL_TABLET | Freq: Four times a day (QID) | ORAL | Status: DC | PRN
  Filled 2012-12-04: qty 1

## 2012-12-04 MED ORDER — SODIUM CHLORIDE 0.9 % IJ SOLN: 3.0000 mL | INTRAMUSCULAR | Status: DC | PRN

## 2012-12-04 MED ORDER — METOCLOPRAMIDE HCL 5 MG/ML IJ SOLN: 10.0000 mg | Freq: Three times a day (TID) | INTRAMUSCULAR | Status: DC | PRN

## 2012-12-04 MED ORDER — DEXTROSE 5 % IV SOLN
3.0000 g | INTRAVENOUS | Status: DC | PRN
  Administered 2012-12-04: 3 g via INTRAVENOUS

## 2012-12-04 MED ORDER — FENTANYL CITRATE 0.05 MG/ML IJ SOLN
INTRAMUSCULAR | Status: AC
  Administered 2012-12-04: 50 ug via INTRAVENOUS
  Filled 2012-12-04: qty 2

## 2012-12-04 MED ORDER — PRENATAL MULTIVITAMIN CH
1.0000 | ORAL_TABLET | Freq: Every day | ORAL | Status: DC
  Administered 2012-12-04 – 2012-12-07 (×4): 1 via ORAL
  Filled 2012-12-04 (×4): qty 1

## 2012-12-04 MED ORDER — BUPIVACAINE IN DEXTROSE 0.75-8.25 % IT SOLN
INTRATHECAL | Status: DC | PRN
  Administered 2012-12-04: 1.6 mL via INTRATHECAL

## 2012-12-04 MED ORDER — HYDROMORPHONE HCL PF 1 MG/ML IJ SOLN
INTRAMUSCULAR | Status: AC
  Filled 2012-12-04: qty 1

## 2012-12-04 MED ORDER — HYDRALAZINE HCL 20 MG/ML IJ SOLN
INTRAMUSCULAR | Status: AC
  Administered 2012-12-04: 5 mg via INTRAVENOUS
  Filled 2012-12-04: qty 1

## 2012-12-04 MED ORDER — KETOROLAC TROMETHAMINE 30 MG/ML IJ SOLN: 30.0000 mg | Freq: Four times a day (QID) | INTRAMUSCULAR | Status: AC | PRN

## 2012-12-04 MED ORDER — NALBUPHINE HCL 10 MG/ML IJ SOLN
5.0000 mg | INTRAMUSCULAR | Status: DC | PRN
  Administered 2012-12-04: 5 mg via SUBCUTANEOUS
  Filled 2012-12-04: qty 1

## 2012-12-04 MED ORDER — NIFEDIPINE ER 30 MG PO TB24
30.0000 mg | ORAL_TABLET | Freq: Two times a day (BID) | ORAL | Status: DC
  Administered 2012-12-04 – 2012-12-07 (×7): 30 mg via ORAL
  Filled 2012-12-04 (×12): qty 1

## 2012-12-04 MED ORDER — ONDANSETRON HCL 4 MG/2ML IJ SOLN: 4.0000 mg | Freq: Three times a day (TID) | INTRAMUSCULAR | Status: DC | PRN

## 2012-12-04 MED ORDER — DIPHENHYDRAMINE HCL 12.5 MG/5ML PO ELIX
12.5000 mg | ORAL_SOLUTION | Freq: Four times a day (QID) | ORAL | Status: DC | PRN
  Filled 2012-12-04: qty 5

## 2012-12-04 MED ORDER — MORPHINE SULFATE 0.5 MG/ML IJ SOLN
INTRAMUSCULAR | Status: AC
  Filled 2012-12-04: qty 10

## 2012-12-04 MED ORDER — OXYTOCIN 10 UNIT/ML IJ SOLN
INTRAMUSCULAR | Status: AC
  Filled 2012-12-04: qty 4

## 2012-12-04 MED ORDER — SCOPOLAMINE 1 MG/3DAYS TD PT72: 1.0000 | MEDICATED_PATCH | Freq: Once | TRANSDERMAL | Status: DC

## 2012-12-04 MED ORDER — SODIUM CHLORIDE 0.9 % IJ SOLN: 9.0000 mL | INTRAMUSCULAR | Status: DC | PRN

## 2012-12-04 MED ORDER — SENNOSIDES-DOCUSATE SODIUM 8.6-50 MG PO TABS
2.0000 | ORAL_TABLET | Freq: Every day | ORAL | Status: DC
  Administered 2012-12-04 – 2012-12-06 (×3): 2 via ORAL

## 2012-12-04 MED ORDER — FENTANYL CITRATE 0.05 MG/ML IJ SOLN
25.0000 ug | INTRAMUSCULAR | Status: DC | PRN
  Administered 2012-12-04 (×4): 50 ug via INTRAVENOUS

## 2012-12-04 MED ORDER — LACTATED RINGERS IV SOLN
Freq: Once | INTRAVENOUS | Status: AC
  Administered 2012-12-04: 07:00:00 via INTRAVENOUS

## 2012-12-04 MED ORDER — DIPHENHYDRAMINE HCL 50 MG/ML IJ SOLN: 12.5000 mg | INTRAMUSCULAR | Status: DC | PRN

## 2012-12-04 MED ORDER — DIPHENHYDRAMINE HCL 25 MG PO CAPS
25.0000 mg | ORAL_CAPSULE | ORAL | Status: DC | PRN
  Administered 2012-12-04 – 2012-12-05 (×3): 25 mg via ORAL
  Filled 2012-12-04 (×3): qty 1

## 2012-12-04 MED ORDER — CEFAZOLIN SODIUM-DEXTROSE 2-3 GM-% IV SOLR
INTRAVENOUS | Status: AC
  Filled 2012-12-04: qty 50

## 2012-12-04 MED ORDER — TETANUS-DIPHTH-ACELL PERTUSSIS 5-2.5-18.5 LF-MCG/0.5 IM SUSP
0.5000 mL | Freq: Once | INTRAMUSCULAR | Status: DC
  Filled 2012-12-04: qty 0.5

## 2012-12-04 MED ORDER — IBUPROFEN 600 MG PO TABS
600.0000 mg | ORAL_TABLET | Freq: Four times a day (QID) | ORAL | Status: DC
  Administered 2012-12-04 – 2012-12-07 (×13): 600 mg via ORAL
  Filled 2012-12-04 (×4): qty 1

## 2012-12-04 MED ORDER — SIMETHICONE 80 MG PO CHEW
80.0000 mg | CHEWABLE_TABLET | ORAL | Status: DC | PRN
  Administered 2012-12-05: 80 mg via ORAL

## 2012-12-04 MED ORDER — SCOPOLAMINE 1 MG/3DAYS TD PT72
MEDICATED_PATCH | TRANSDERMAL | Status: AC
  Filled 2012-12-04: qty 1

## 2012-12-04 MED ORDER — NALBUPHINE SYRINGE 5 MG/0.5 ML
5.0000 mg | INJECTION | INTRAMUSCULAR | Status: DC | PRN
  Filled 2012-12-04: qty 1

## 2012-12-04 MED ORDER — ONDANSETRON HCL 4 MG/2ML IJ SOLN: 4.0000 mg | INTRAMUSCULAR | Status: DC | PRN

## 2012-12-04 MED ORDER — HYDROMORPHONE HCL PF 1 MG/ML IJ SOLN
INTRAMUSCULAR | Status: DC | PRN
  Administered 2012-12-04: 1 mg via INTRAVENOUS

## 2012-12-04 MED ORDER — LACTATED RINGERS IV SOLN
INTRAVENOUS | Status: DC
  Administered 2012-12-04: 15:00:00 via INTRAVENOUS

## 2012-12-04 MED ORDER — NALBUPHINE SYRINGE 5 MG/0.5 ML
INJECTION | INTRAMUSCULAR | Status: AC
  Administered 2012-12-04: 10 mg via SUBCUTANEOUS
  Filled 2012-12-04: qty 0.5

## 2012-12-04 MED ORDER — HYDROMORPHONE 0.3 MG/ML IV SOLN
INTRAVENOUS | Status: DC
  Administered 2012-12-04: 0.04 mg via INTRAVENOUS
  Administered 2012-12-04: 13:00:00 via INTRAVENOUS
  Filled 2012-12-04: qty 25

## 2012-12-04 MED ORDER — LACTATED RINGERS IV SOLN
INTRAVENOUS | Status: DC
  Administered 2012-12-04 (×3): via INTRAVENOUS
  Administered 2012-12-04: 250 mL via INTRAVENOUS

## 2012-12-04 MED ORDER — HYDRALAZINE HCL 20 MG/ML IJ SOLN
5.0000 mg | INTRAMUSCULAR | Status: DC | PRN
  Administered 2012-12-04 (×3): 5 mg via INTRAVENOUS

## 2012-12-04 MED ORDER — LABETALOL HCL 5 MG/ML IV SOLN
INTRAVENOUS | Status: AC
  Administered 2012-12-04: 10 mg via INTRAVENOUS
  Filled 2012-12-04: qty 4

## 2012-12-04 MED ORDER — DEXTROSE 5 % IV SOLN
3.0000 g | Freq: Once | INTRAVENOUS | Status: DC
  Filled 2012-12-04: qty 3000

## 2012-12-04 MED ORDER — ONDANSETRON HCL 4 MG/2ML IJ SOLN
INTRAMUSCULAR | Status: DC | PRN
  Administered 2012-12-04: 4 mg via INTRAVENOUS

## 2012-12-04 MED ORDER — ZOLPIDEM TARTRATE 5 MG PO TABS: 5.0000 mg | ORAL_TABLET | Freq: Every evening | ORAL | Status: DC | PRN

## 2012-12-04 MED ORDER — LACTATED RINGERS IV BOLUS (SEPSIS): 250.0000 mL | Freq: Once | INTRAVENOUS | Status: DC

## 2012-12-04 MED ORDER — LANOLIN HYDROUS EX OINT: 1.0000 "application " | TOPICAL_OINTMENT | CUTANEOUS | Status: DC | PRN

## 2012-12-04 MED ORDER — FAMOTIDINE 20 MG PO TABS
20.0000 mg | ORAL_TABLET | Freq: Two times a day (BID) | ORAL | Status: DC
  Administered 2012-12-04 – 2012-12-07 (×6): 20 mg via ORAL
  Filled 2012-12-04 (×6): qty 1

## 2012-12-04 MED ORDER — MORPHINE SULFATE (PF) 0.5 MG/ML IJ SOLN
INTRAMUSCULAR | Status: DC | PRN
  Administered 2012-12-04: .15 mg via EPIDURAL

## 2012-12-04 MED ORDER — FENTANYL CITRATE 0.05 MG/ML IJ SOLN
INTRAMUSCULAR | Status: AC
  Filled 2012-12-04: qty 2

## 2012-12-04 MED ORDER — IBUPROFEN 600 MG PO TABS
600.0000 mg | ORAL_TABLET | Freq: Four times a day (QID) | ORAL | Status: DC | PRN
  Filled 2012-12-04 (×8): qty 1

## 2012-12-04 MED ORDER — NALBUPHINE HCL 20 MG/ML IJ SOLN
5.0000 mg | INTRAMUSCULAR | Status: DC | PRN
  Administered 2012-12-04: 10 mg via SUBCUTANEOUS
  Filled 2012-12-04: qty 0.5

## 2012-12-04 MED ORDER — PHENYLEPHRINE HCL 10 MG/ML IJ SOLN
INTRAMUSCULAR | Status: DC | PRN
  Administered 2012-12-04 (×2): 80 ug via INTRAVENOUS
  Administered 2012-12-04: 120 ug via INTRAVENOUS
  Administered 2012-12-04 (×2): 80 ug via INTRAVENOUS
  Administered 2012-12-04: 40 ug via INTRAVENOUS
  Administered 2012-12-04: 120 ug via INTRAVENOUS
  Administered 2012-12-04 (×2): 80 ug via INTRAVENOUS

## 2012-12-04 MED ORDER — ONDANSETRON HCL 4 MG PO TABS: 4.0000 mg | ORAL_TABLET | ORAL | Status: DC | PRN

## 2012-12-04 MED ORDER — OXYTOCIN 10 UNIT/ML IJ SOLN
40.0000 [IU] | INTRAVENOUS | Status: DC | PRN
  Administered 2012-12-04: 40 [IU] via INTRAVENOUS

## 2012-12-04 MED ORDER — NALBUPHINE SYRINGE 5 MG/0.5 ML
INJECTION | INTRAMUSCULAR | Status: AC
  Administered 2012-12-04: 5 mg via SUBCUTANEOUS
  Filled 2012-12-04: qty 0.5

## 2012-12-04 MED ORDER — OXYCODONE-ACETAMINOPHEN 5-325 MG PO TABS
1.0000 | ORAL_TABLET | ORAL | Status: DC | PRN
  Administered 2012-12-05: 2 via ORAL
  Administered 2012-12-05 (×2): 1 via ORAL
  Administered 2012-12-06: 2 via ORAL
  Administered 2012-12-06 (×2): 1 via ORAL
  Administered 2012-12-07 (×2): 2 via ORAL
  Filled 2012-12-04 (×2): qty 2
  Filled 2012-12-04: qty 1
  Filled 2012-12-04: qty 2
  Filled 2012-12-04 (×3): qty 1
  Filled 2012-12-04 (×2): qty 2

## 2012-12-04 SURGICAL SUPPLY — 34 items
BENZOIN TINCTURE PRP APPL 2/3 (GAUZE/BANDAGES/DRESSINGS) ×2 IMPLANT
CLOTH BEACON ORANGE TIMEOUT ST (SAFETY) ×2 IMPLANT
CONTAINER PREFILL 10% NBF 15ML (MISCELLANEOUS) IMPLANT
DRAPE LG THREE QUARTER DISP (DRAPES) ×2 IMPLANT
DRSG OPSITE POSTOP 4X10 (GAUZE/BANDAGES/DRESSINGS) IMPLANT
DURAPREP 26ML APPLICATOR (WOUND CARE) ×2 IMPLANT
ELECT REM PT RETURN 9FT ADLT (ELECTROSURGICAL) ×2
ELECTRODE REM PT RTRN 9FT ADLT (ELECTROSURGICAL) ×1 IMPLANT
EXTRACTOR VACUUM KIWI (MISCELLANEOUS) IMPLANT
EXTRACTOR VACUUM M CUP 4 TUBE (SUCTIONS) IMPLANT
GLOVE BIO SURGEON STRL SZ 6.5 (GLOVE) ×2 IMPLANT
GOWN PREVENTION PLUS LG XLONG (DISPOSABLE) ×4 IMPLANT
KIT ABG SYR 3ML LUER SLIP (SYRINGE) IMPLANT
NEEDLE HYPO 25X5/8 SAFETYGLIDE (NEEDLE) IMPLANT
NS IRRIG 1000ML POUR BTL (IV SOLUTION) ×2 IMPLANT
PACK C SECTION WH (CUSTOM PROCEDURE TRAY) ×2 IMPLANT
PAD OB MATERNITY 4.3X12.25 (PERSONAL CARE ITEMS) ×2 IMPLANT
RTRCTR C-SECT PINK 25CM LRG (MISCELLANEOUS) ×2 IMPLANT
SLEEVE SCD COMPRESS KNEE MED (MISCELLANEOUS) IMPLANT
STAPLER VISISTAT 35W (STAPLE) IMPLANT
STRIP CLOSURE SKIN 1/2X4 (GAUZE/BANDAGES/DRESSINGS) ×4 IMPLANT
SUT CHROMIC 1 CTX 36 (SUTURE) ×4 IMPLANT
SUT PLAIN 0 NONE (SUTURE) IMPLANT
SUT PLAIN 2 0 XLH (SUTURE) ×2 IMPLANT
SUT VIC AB 0 CT1 27 (SUTURE) ×2
SUT VIC AB 0 CT1 27XBRD ANBCTR (SUTURE) ×2 IMPLANT
SUT VIC AB 2-0 CT1 27 (SUTURE) ×1
SUT VIC AB 2-0 CT1 TAPERPNT 27 (SUTURE) ×1 IMPLANT
SUT VIC AB 2-0 SH 27 (SUTURE) ×1
SUT VIC AB 2-0 SH 27XBRD (SUTURE) ×1 IMPLANT
SUT VIC AB 4-0 KS 27 (SUTURE) IMPLANT
TOWEL OR 17X24 6PK STRL BLUE (TOWEL DISPOSABLE) ×6 IMPLANT
TRAY FOLEY CATH 14FR (SET/KITS/TRAYS/PACK) ×2 IMPLANT
WATER STERILE IRR 1000ML POUR (IV SOLUTION) IMPLANT

## 2012-12-04 NOTE — Brief Op Note (Signed)
12/04/2012  9:08 AM  PATIENT:  RECHELLE NIEBLA  31 y.o. female  PRE-OPERATIVE DIAGNOSIS:  previous c/s; EDC 12/6, twins breech/vertex  POST-OPERATIVE DIAGNOSIS:  previous c/s; [facility] 12/6  PROCEDURE:  Procedure(s) (LRB) with comments: CESAREAN SECTION (N/A) - repeat c/s-TWINS  SURGEON:  Surgeon(s) and Role:    * Oliver Pila, MD - Primary       Tracey Harries, MD Assist  ANESTHESIA:   spinal  EBL:  Total I/O In: 3100 [I.V.:3100] Out: 775 [Urine:75; Blood:700]  BLOOD ADMINISTERED:none  DRAINS: Urinary Catheter (Foley)   LOCAL MEDICATIONS USED:  NONE  SPECIMEN:  Placenta  DISPOSITION OF SPECIMEN:  L&D  COUNTS:  YES  TOURNIQUET:  * No tourniquets in log *  DICTATION: .Dragon Dictation  PLAN OF CARE: Admit to inpatient   PATIENT DISPOSITION:  PACU - hemodynamically stable.

## 2012-12-04 NOTE — Addendum Note (Signed)
Addendum  created 12/04/12 2215 by Graciela Husbands, CRNA   Modules edited:Notes Section

## 2012-12-04 NOTE — Anesthesia Procedure Notes (Signed)
Spinal  Patient location during procedure: OR Start time: 12/04/2012 7:45 AM Staffing Anesthesiologist: Larken Urias A. Performed by: anesthesiologist  Preanesthetic Checklist Completed: patient identified, site marked, surgical consent, pre-op evaluation, timeout performed, IV checked, risks and benefits discussed and monitors and equipment checked Spinal Block Patient position: sitting Prep: site prepped and draped and DuraPrep Patient monitoring: heart rate, cardiac monitor, continuous pulse ox and blood pressure Approach: midline Location: L3-4 Injection technique: single-shot Needle Needle type: Sprotte and Tuohy  Needle gauge: 24 G Needle length: 12.7 cm Needle insertion depth: 9 cm Assessment Sensory level: T4 Additional Notes Patient tolerated procedure well. Technically difficult due to morbid obesity. Touhy 17ga with LOR with air at 9cm. Spinocan through Touhy, CSF clear free flow, no heme no paresthesias, no CSF. Patient tolerated procedure well. Adequate sensory level.

## 2012-12-04 NOTE — Anesthesia Preprocedure Evaluation (Addendum)
Anesthesia Evaluation  Patient identified by MRN, date of birth, ID band Patient awake    Reviewed: Allergy & Precautions, H&P , NPO status , Patient's Chart, lab work & pertinent test results, reviewed documented beta blocker date and time   History of Anesthesia Complications Negative for: history of anesthetic complications  Airway Mallampati: IV TM Distance: >3 FB Neck ROM: full    Dental  (+) Teeth Intact and Chipped,    Pulmonary shortness of breath (with pregnancy), sleep apnea ("only with pregnancy", doesn't use CPAP) ,  breath sounds clear to auscultation  Pulmonary exam normal       Cardiovascular negative cardio ROS  Rhythm:regular Rate:Normal     Neuro/Psych  Headaches, Seizures - (no meds, last was 2 years ago),  negative psych ROS   GI/Hepatic Neg liver ROS, GERD-  Medicated and Controlled,  Endo/Other  Morbid obesity  Renal/GU negative Renal ROS  negative genitourinary   Musculoskeletal negative musculoskeletal ROS (+)   Abdominal (+) + obese,   Peds  Hematology negative hematology ROS (+)   Anesthesia Other Findings   Reproductive/Obstetrics (+) Pregnancy (h/o c/s x1) Twin Gestation Previous C/Section                          Anesthesia Physical Anesthesia Plan  ASA: III  Anesthesia Plan: Spinal   Post-op Pain Management:    Induction:   Airway Management Planned: Natural Airway  Additional Equipment:   Intra-op Plan:   Post-operative Plan:   Informed Consent: I have reviewed the patients History and Physical, chart, labs and discussed the procedure including the risks, benefits and alternatives for the proposed anesthesia with the patient or authorized representative who has indicated his/her understanding and acceptance.   Dental advisory given  Plan Discussed with: CRNA, Surgeon and Anesthesiologist  Anesthesia Plan Comments:        Anesthesia Quick  Evaluation

## 2012-12-04 NOTE — Anesthesia Postprocedure Evaluation (Signed)
  Anesthesia Post-op Note  Patient: Jaime Riggs  Procedure(s) Performed: Procedure(s) (LRB) with comments: CESAREAN SECTION (N/A) - repeat c/s-TWINS  Patient Location: A-ICU  Anesthesia Type:Spinal  Level of Consciousness: awake, alert  and oriented  Airway and Oxygen Therapy: Patient Spontanous Breathing and Patient connected to nasal cannula oxygen  Post-op Pain: mild  Post-op Assessment: Post-op Vital signs reviewed and Patient's Cardiovascular Status Stable  Post-op Vital Signs: Reviewed and stable  Complications: No apparent anesthesia complications

## 2012-12-04 NOTE — Op Note (Signed)
Operative note  Preoperative diagnosis Twin gestation at 37 weeks  Previous C-section Breech/ vertex presentation of twins Chronic anemia  Postoperative diagnosis Same except twins were breech / breech presenting  Procedure Repeat low transverse C-section with 2 layer closure of uterus  Surgeon Dr. Huel Cote Dr. Tracey Harries  Anesthesia Spinal  Fluids Estimated blood loss 700 cc Urine output 200 cc clear urine IV fluid 2200 cc LR  Findings There was a viable female infant as twin A presenting breech, Apgars were 8 and 10 and weight pending at time of dictation. Twin B was also breech presenting and a female. Apgars were 8 and 9 weight pending at time of dictation. The pelvic anatomy was normal with uterus tubes and ovaries noted to be normal in appearance.  Specimen Placentas were sent to pathology  Procedure note After informed consent was obtained patient was taken to the operating room where spinal anesthesia was obtained without difficulty. She was prepped and draped in the normal sterile fashion in the dorsal supine position with a leftward tilt and an appropriate time out was performed. Was confirmed that there were 2 units of blood available in the room given her starting low hemoglobin. A Pfannenstiel skin incision was then made through pre-existing scar and carried through to the underlying layer of fascia by sharp dissection and Bovie cautery. The fascia was then nicked in the midline and extended laterally with Mayo scissors. The inferior aspect was grasped Coker clamps elevated and dissected off the underlying rectus muscles. The superior aspect was likewise dissected off the rectus muscles. Rectus muscles were separated in the midline and the peritoneal cavity entered sharply. The peritoneal incision was extended both superiorly and inferiorly with careful attention to avoid both bowel bladder. The Alexis self-retaining wound retractor was then placed within the  incision and lower uterine segment exposed. The bladder flap could not be developed as it appeared to be deep in the pelvis therefore a transverse incision was made with the scalpel and the uterine cavity entered bluntly. Clear fluid was noted and twin A was presenting footling breech. The feet were grasped and the infant delivered with gentle traction down to the level of the scapula the arms were reduced over the chest and head delivered in a flexed position without difficulty. The cord was clamped and cut and infant handed off to the waiting pediatricians. The next bag was then ruptured and this twin was found to be slightly transverse with the feet down. Therefore the baby was delivered breech in a similar fashion with gentle traction bringing the infant down to the level of the scapula the arms were reduced over the chest and head delivered in a flexed fashion. Again the nose and mouth were bulb suctioned and infant handed to the waiting pediatrician. Cord blood was obtained and the placentas delivered without difficulty. The uterus contracted down well and the uterine incision was closed in 2 layers the first a running locked layer 1-0 chromic and the second an imbricating layer of the same suture. The right angle was also reinforced with a figure-of-eight suture of 1-0 chromic. The tubes and ovaries were inspected and found to be hemostatic the incision was dry therefore the Alexis retractor was removed. Patient was noted to have significant edema and her abdominal wall and peritoneal cavity. The rectus muscle and peritoneum were then reapproximated with several interrupted mattress sutures of 2-0 Vicryl. The fascia was closed with 0 Vicryl in a running fashion. The subcutaneous tissue was reapproximated  with 3-0 plain in a running fashion. The skin was closed in a subcuticular stitch with 3-0 Vicryl on a Keith needle and reinforced with benzoin and Steri-Strips. The patient was doing well and the babies were  taken with her to the recovery room in good condition. All instruments and sponge counts were correct. Patient was noted to have elevated blood pressures with the procedure with diastolics approaching 100 and this had not been the case in the office so PIH labs will be checked to the recovery room.

## 2012-12-04 NOTE — Progress Notes (Signed)
Patient ID: Jaime Riggs, female   DOB: January 05, 1981, 31 y.o.   MRN: [id number] Per pt no changes in dictated H&P and brief exam WNL.  Discussed again with her likelihood of transfusion and she is ready to proceed and agreeable to that.

## 2012-12-04 NOTE — Transfer of Care (Signed)
Immediate Anesthesia Transfer of Care Note  Patient: Jaime Riggs  Procedure(s) Performed: Procedure(s) (LRB) with comments: CESAREAN SECTION (N/A) - repeat c/s-TWINS  Patient Location: PACU  Anesthesia Type:Spinal  Level of Consciousness: awake, alert  and oriented  Airway & Oxygen Therapy: Patient Spontanous Breathing  Post-op Assessment: Report given to PACU RN and Post -op Vital signs reviewed and stable  Post vital signs: stable  Complications: No apparent anesthesia complications

## 2012-12-04 NOTE — Anesthesia Postprocedure Evaluation (Signed)
Anesthesia Post Note  Patient: Jaime Riggs  Procedure(s) Performed: Procedure(s) (LRB): CESAREAN SECTION (N/A)  Anesthesia type: Spinal  Patient location: PACU  Post pain: Pain level controlled  Post assessment: Post-op Vital signs reviewed  Last Vitals:  Filed Vitals:   12/04/12 0627  BP: 156/91  Pulse: 114  Temp: 36.8 C  Resp: 18    Post vital signs: Reviewed  Level of consciousness: awake  Complications: No apparent anesthesia complications

## 2012-12-05 ENCOUNTER — Encounter (HOSPITAL_COMMUNITY): Payer: Self-pay | Admitting: Obstetrics and Gynecology

## 2012-12-05 LAB — CBC
HCT: 26 % — ABNORMAL LOW (ref 36.0–46.0)
Hemoglobin: 7.6 g/dL — ABNORMAL LOW (ref 12.0–15.0)
MCV: 64.2 fL — ABNORMAL LOW (ref 78.0–100.0)
RDW: 19.7 % — ABNORMAL HIGH (ref 11.5–15.5)
WBC: 16.2 10*3/uL — ABNORMAL HIGH (ref 4.0–10.5)

## 2012-12-05 NOTE — Care Management Note (Signed)
    Page 1 of 1   12/05/2012     1:37:26 PM   CARE MANAGEMENT NOTE 12/05/2012  Patient:  Jaime Riggs, Jaime Riggs   Account Number:  [id number]  Date Initiated:  12/05/2012  Documentation initiated by:  Roseanne Reno  Subjective/Objective Assessment:   Sleep apnea, Obesity     Action/Plan:   CPAP Machine   Anticipated DC Date:  12/08/2012   Anticipated DC Plan:  HOME/SELF CARE        Comments:  12/05/12  1335p  CM referral noted requesting assistance in helping the pt get her broken CPAP Machine repaired.  Spoke w/ the pt in her room about the CPAP Machine.  Pt stated that she did not remember where the machine came from.  She stated that there was no identifiying sticker on the machine.  She stated that [facility].  [facility] called [facility] [phone]) and spoke w/ Summer.  Summer stated that the pt did receive a CPAP Machine in May of 2010 for apnea.  The company that supplied the CPAP is [employer] [phone]).  Summer stated that they had not seen the pt since that time and unsure if pt is being followed by anyone for the apnea and CPAP.  Suggested that pt be seen by her PCP and possibly have another sleep study.  Pt would need to follow up with [employer] and go through the repair process for the broken machine or she could obtain a new CPAP from another agency and pay out of pocket.  There is a 5 year rule on obtaining a new CPAP and the pt is in her 3rd year at this time and would not qualify for a new machine to be covered under insurance.  CM notified pt of above info and gave her the name and number for [employer].  Pt voiced understanding and stated that she didn't think she had the CPAP machine anymore - it may have gotten misplaced or even thrown away since it didn't work.  CM availalbe to assist as needed. TJohnson, RNBSN  [phone]

## 2012-12-05 NOTE — Progress Notes (Signed)
Subjective: Postpartum Day 1 Cesarean Delivery Patient reports incisional pain and tolerating PO.  Able to walk to bathroom and void  Objective: Vital signs in last 24 hours: Temp:  [97.6 F (36.4 C)-98.3 F (36.8 C)] 98.3 F (36.8 C) (12/17 0800) Pulse Rate:  [90-117] 104  (12/17 0700) Resp:  [15-26] 24  (12/17 0652) BP: (126-185)/(63-126) 132/70 mmHg (12/17 0603) SpO2:  [96 %-100 %] 98 % (12/17 0700) FiO2 (%):  [2 %] 2 % (12/17 0020) Weight:  [139.708 kg (308 lb)] 139.708 kg (308 lb) (12/16 1300)  Physical Exam:  General: sleepy but responsive Lochia: appropriate Uterine Fundus: firm Incision: C/D/I    Basename 12/05/12 0523 12/04/12 0927  HGB 7.6* 7.9*  HCT 26.0* 27.2*    Assessment/Plan: Status post Cesarean section. Doing well postoperatively.  Continue current care. BP stable on Procardia, will transfer to floor Hemoglobin stable, pt not symptomatic.  Jaime Riggs 12/05/2012, 9:16 AM

## 2012-12-06 NOTE — Progress Notes (Signed)
Patient ID: Jaime Riggs, female   DOB: 1981-07-28, 31 y.o.   MRN: [id number] The pt c/o left temporal HA that began when she got up to void. She states the HA is definitely better when she lies down. She had no aura. She has had some photophobia but no phonophobia She describes the HA as steady rather than throbbing. She has had no nausea or vomiting I have advised her to lie flat when she can and report if the HA worsens or is definitely related to position and I will ask the anesthesiologist to evaluate her. I also advised her to drink fluids.

## 2012-12-06 NOTE — Progress Notes (Signed)
Subjective: Postpartum Day 2 Cesarean Delivery Patient reports tolerating PO and no problems voiding.  Looks much better  Objective: Vital signs in last 24 hours: Temp:  [97.9 F (36.6 C)-98.9 F (37.2 C)] 97.9 F (36.6 C) (12/18 0545) Pulse Rate:  [109-114] 112  (12/18 0545) Resp:  [20] 20  (12/18 0545) BP: (120-138)/(81-83) 130/81 mmHg (12/18 0545) SpO2:  [97 %-100 %] 100 % (12/18 0825)  Physical Exam:  General: alert and cooperative Lochia: appropriate Uterine Fundus: firm Incision: healing well    Basename 12/05/12 0523 12/04/12 0927  HGB 7.6* 7.9*  HCT 26.0* 27.2*    Assessment/Plan: Status post Cesarean section. Doing well postoperatively.  Continue current care. BP stable on procardia Tyrihanna Wingert W 12/06/2012, 8:57 AM

## 2012-12-07 MED ORDER — OXYCODONE-ACETAMINOPHEN 5-325 MG PO TABS: 1.0000 | ORAL_TABLET | ORAL | Status: DC | PRN

## 2012-12-07 MED ORDER — IBUPROFEN 600 MG PO TABS: 600.0000 mg | ORAL_TABLET | Freq: Four times a day (QID) | ORAL | Status: DC | PRN

## 2012-12-07 MED ORDER — NIFEDIPINE ER 30 MG PO TB24: 30.0000 mg | ORAL_TABLET | Freq: Two times a day (BID) | ORAL | Status: DC

## 2012-12-07 NOTE — Progress Notes (Signed)
Subjective: Postpartum Day 3 Cesarean Delivery Patient reports incisional pain, tolerating PO and no problems voiding.    Objective: Vital signs in last 24 hours: Temp:  [97.5 F (36.4 C)-98 F (36.7 C)] 97.5 F (36.4 C) (12/19 0605) Pulse Rate:  [103-121] 103  (12/19 0605) Resp:  [20-22] 22  (12/19 0605) BP: (128-150)/(78-96) 150/96 mmHg (12/19 0605) SpO2:  [100 %] 100 % (12/18 2200)  Physical Exam:  General: alert and cooperative Lochia: appropriate Uterine Fundus: firm Incision: C/D/I   Basename 12/05/12 0523 12/04/12 0927  HGB 7.6* 7.9*  HCT 26.0* 27.2*    Assessment/Plan: Status post Cesarean section. Doing well postoperatively.  Discharge home with standard precautions and return to clinic in 10 days.  Oliver Pila 12/07/2012, 8:58 AM

## 2012-12-07 NOTE — Discharge Summary (Signed)
Obstetric Discharge Summary Reason for Admission: cesarean section Prenatal Procedures: ultrasound Intrapartum Procedures: cesarean: low cervical, transverse Postpartum Procedures: antihypertensives Complications-Operative and Postpartum: hypertension Hemoglobin  Date Value Range Status  12/05/2012 7.6* 12.0 - 15.0 g/dL Final     HCT  Date Value Range Status  12/05/2012 26.0* 36.0 - 46.0 % Final    Physical Exam:  General: alert and cooperative Lochia: appropriate Uterine Fundus: firm Incision: healing well   Discharge Diagnoses: Term Pregnancy-delivered        Postpartum hypertension  Discharge Information: Date: 12/07/2012 Activity: pelvic rest Diet: routine Medications: Ibuprofen, Percocet and Procardia Condition: improved Instructions: refer to practice specific booklet Discharge to: home   Newborn Data:   Melanny, Wire [[id number]]  Live born female  Birth Weight: 6 lb 1.2 oz (2756 g) APGAR: 8, 10   Jaidan, Stachnik [[id number]]  Live born female  Birth Weight: 5 lb 14.5 oz (2680 g) APGAR: 8, 9  Home with mother.  Oliver Pila 12/07/2012, 9:38 AM

## 2013-04-05 ENCOUNTER — Emergency Department (HOSPITAL_COMMUNITY): Payer: No Typology Code available for payment source

## 2013-04-05 ENCOUNTER — Emergency Department (HOSPITAL_COMMUNITY)
Admission: EM | Admit: 2013-04-05 | Discharge: 2013-04-05 | Disposition: A | Discharge status: Home / Self Care | Payer: No Typology Code available for payment source | Attending: Emergency Medicine | Admitting: Emergency Medicine

## 2013-04-05 ENCOUNTER — Encounter (HOSPITAL_COMMUNITY): Payer: Self-pay | Admitting: Emergency Medicine

## 2013-04-05 DIAGNOSIS — Y9229 Other specified public building as the place of occurrence of the external cause: Secondary | ICD-10-CM | POA: Insufficient documentation

## 2013-04-05 DIAGNOSIS — Y9389 Activity, other specified: Secondary | ICD-10-CM | POA: Insufficient documentation

## 2013-04-05 DIAGNOSIS — Z79899 Other long term (current) drug therapy: Secondary | ICD-10-CM | POA: Insufficient documentation

## 2013-04-05 DIAGNOSIS — G4730 Sleep apnea, unspecified: Secondary | ICD-10-CM | POA: Insufficient documentation

## 2013-04-05 DIAGNOSIS — K219 Gastro-esophageal reflux disease without esophagitis: Secondary | ICD-10-CM | POA: Insufficient documentation

## 2013-04-05 DIAGNOSIS — Z8669 Personal history of other diseases of the nervous system and sense organs: Secondary | ICD-10-CM | POA: Insufficient documentation

## 2013-04-05 DIAGNOSIS — IMO0002 Reserved for concepts with insufficient information to code with codable children: Secondary | ICD-10-CM | POA: Insufficient documentation

## 2013-04-05 DIAGNOSIS — G43909 Migraine, unspecified, not intractable, without status migrainosus: Secondary | ICD-10-CM | POA: Insufficient documentation

## 2013-04-05 DIAGNOSIS — E669 Obesity, unspecified: Secondary | ICD-10-CM | POA: Insufficient documentation

## 2013-04-05 DIAGNOSIS — M549 Dorsalgia, unspecified: Secondary | ICD-10-CM

## 2013-04-05 MED ORDER — CYCLOBENZAPRINE HCL 10 MG PO TABS: 10.0000 mg | ORAL_TABLET | Freq: Three times a day (TID) | ORAL | Status: DC | PRN

## 2013-04-05 MED ORDER — IBUPROFEN 800 MG PO TABS: 800.0000 mg | ORAL_TABLET | Freq: Three times a day (TID) | ORAL | Status: DC | PRN

## 2013-04-05 NOTE — ED Provider Notes (Signed)
Medical screening examination/treatment/procedure(s) were performed by non-physician practitioner and as supervising physician I was immediately available for consultation/collaboration.  Deagen Krass R. Virlan Kempker, MD 04/05/13 2336 
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
## 2013-04-07 ENCOUNTER — Emergency Department (HOSPITAL_COMMUNITY)
Admission: EM | Admit: 2013-04-07 | Discharge: 2013-04-07 | Disposition: A | Discharge status: Home / Self Care | Payer: Medicaid Other | Source: Home / Self Care

## 2013-04-07 ENCOUNTER — Encounter (HOSPITAL_COMMUNITY): Payer: Self-pay | Admitting: Emergency Medicine

## 2013-04-07 DIAGNOSIS — J020 Streptococcal pharyngitis: Secondary | ICD-10-CM

## 2013-04-07 MED ORDER — AMOXICILLIN 500 MG PO CAPS: 1000.0000 mg | ORAL_CAPSULE | Freq: Two times a day (BID) | ORAL | Status: DC

## 2013-04-07 NOTE — ED Notes (Signed)
Pt c/o sore throat x 2 days. Post nasal drip. Denies any other symptoms.   Pt has not tried any otc meds for treatment
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
## 2013-04-07 NOTE — ED Provider Notes (Signed)
Medical screening examination/treatment/procedure(s) were performed by non-physician practitioner and as supervising physician I was immediately available for consultation/collaboration.  Leslee Home, M.D.  Reuben Likes, MD 04/07/13 Mikle Bosworth
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
## 2013-09-27 ENCOUNTER — Emergency Department (HOSPITAL_COMMUNITY)
Admission: EM | Admit: 2013-09-27 | Discharge: 2013-09-27 | Disposition: A | Discharge status: Home / Self Care | Payer: Medicaid Other | Source: Home / Self Care

## 2013-09-27 ENCOUNTER — Encounter (HOSPITAL_COMMUNITY): Payer: Self-pay | Admitting: Emergency Medicine

## 2013-09-27 DIAGNOSIS — B084 Enteroviral vesicular stomatitis with exanthem: Secondary | ICD-10-CM

## 2013-09-27 DIAGNOSIS — M542 Cervicalgia: Secondary | ICD-10-CM

## 2013-09-27 LAB — POCT RAPID STREP A: Streptococcus, Group A Screen (Direct): NEGATIVE

## 2013-09-27 MED ORDER — ETODOLAC 500 MG PO TABS: 500.0000 mg | ORAL_TABLET | Freq: Two times a day (BID) | ORAL | Status: DC | PRN

## 2013-09-27 NOTE — ED Provider Notes (Signed)
CSN: [id number]     Arrival date & time 09/27/13  1050 History   None    Chief Complaint  Patient presents with  . Rash   (Consider location/radiation/quality/duration/timing/severity/associated sxs/prior Treatment) HPI Comments: 32 year old female presents complaining of sore throat for 3 days and noticing a rash on her hands today she is worried she may have hand foot mouth disease. She has no known contact with anyone with this illness. She denies systemic symptoms including no fever, chills, malaise or fatigue. No cough, shortness of breath, NVD. Rash is confined to the palms of both hands and slightly on the back of the hands. The throat seems to be getting slightly better. Also, she is complaining of chronic neck pain for the last few years. No new injury or recent worsening of the pain  Patient is a 32 y.o. female presenting with rash.  Rash Associated symptoms: sore throat   Associated symptoms: no chest pain, no chills, no cough, no dysuria, no fever, no nausea, no shortness of breath and no vomiting     Past Medical History  Diagnosis Date  . Migraines   . Neck mass   . Sleep apnea   . Obesity (BMI 30-39.9) 03/27/2012  . Epilepsy     last episode  two yrs.  Marland Kitchen GERD (gastroesophageal reflux disease)   . Shortness of breath     with preg with multiple only   Past Surgical History  Procedure Laterality Date  . Cesarean section    . Mouth surgery    . Neck mass excision  04/04/2012    pathology c/w lipoma  . Cesarean section  12/04/2012    Procedure: CESAREAN SECTION;  Surgeon: Oliver Pila, MD;  Location: [facility];  Service: Obstetrics;  Laterality: N/A;  repeat c/s-TWINS   Family History  Problem Relation Age of Onset  . Cancer Maternal Aunt     unknown  . Cancer Maternal Grandfather     lung  . Cancer Paternal Grandmother     ovarian  . Other Neg Hx    History  Substance Use Topics  . Smoking status: Never Smoker   . Smokeless tobacco: Not on file  .  Alcohol Use: No   OB History   Grav Para Term Preterm Abortions TAB SAB Ect Mult Living   2 2 2      1 3      Review of Systems  Constitutional: Negative for fever and chills.  HENT: Positive for congestion and sore throat.   Eyes: Negative for visual disturbance.  Respiratory: Negative for cough and shortness of breath.   Cardiovascular: Negative for chest pain, palpitations and leg swelling.  Gastrointestinal: Negative for nausea, vomiting and abdominal pain.  Endocrine: Negative for polydipsia and polyuria.  Genitourinary: Negative for dysuria, urgency and frequency.  Musculoskeletal: Positive for arthralgias, back pain and neck pain. Negative for myalgias.  Skin: Positive for rash.  Neurological: Negative for dizziness, weakness and light-headedness.    Allergies  Review of patient's allergies indicates no known allergies.  Home Medications   Current Outpatient Rx  Name  Route  Sig  Dispense  Refill  . amoxicillin (AMOXIL) 500 MG capsule   Oral   Take 2 capsules (1,000 mg total) by mouth 2 (two) times daily.   28 capsule   0   . cyclobenzaprine (FLEXERIL) 10 MG tablet   Oral   Take 1 tablet (10 mg total) by mouth 3 (three) times daily as needed for muscle spasms.  15 tablet   0   . etodolac (LODINE) 500 MG tablet   Oral   Take 1 tablet (500 mg total) by mouth 2 (two) times daily as needed.   60 tablet   0   . ibuprofen (ADVIL,MOTRIN) 800 MG tablet   Oral   Take 1 tablet (800 mg total) by mouth every 8 (eight) hours as needed for pain.   15 tablet   0   . PRESCRIPTION MEDICATION   Oral   Take 1 tablet by mouth daily. Birth Control.          BP 148/88  Pulse 72  Temp(Src) 98.6 F (37 C) (Oral)  Resp 16  SpO2 100% Physical Exam  Nursing note and vitals reviewed. Constitutional: She is oriented to person, place, and time. Vital signs are normal. She appears well-developed and well-nourished. No distress.  HENT:  Head: Normocephalic and atraumatic.   Mouth/Throat: Posterior oropharyngeal erythema present. No oropharyngeal exudate.  Pulmonary/Chest: Effort normal. No respiratory distress.  Musculoskeletal:       Cervical back: She exhibits tenderness (diffuse), bony tenderness and pain. She exhibits no edema, no deformity and no spasm.  Neurological: She is alert and oriented to person, place, and time. She has normal strength. Coordination normal.  Skin: Skin is warm and dry. Rash (erythematous macules on the palms only) noted. She is not diaphoretic.  Psychiatric: She has a normal mood and affect. Judgment normal.    ED Course  Procedures (including critical care time) Labs Review Labs Reviewed  CULTURE, GROUP A STREP  POCT RAPID STREP A ([facility] ONLY)   Imaging Review No results found.    MDM   1. Hand, foot and mouth disease   2. Neck pain    This does appear to be hand foot mouth disease. Treat symptomatically. Will prescribe Lodine for the musculoskeletal neck pain. Followup with primary care   Meds ordered this encounter  Medications  . etodolac (LODINE) 500 MG tablet    Sig: Take 1 tablet (500 mg total) by mouth 2 (two) times daily as needed.    Dispense:  60 tablet    Refill:  0    Order Specific Question:  Supervising Provider    Answer:  Lorenz Coaster, DAVID C [[id number]]     Graylon Good, PA-C 09/28/13 [phone]

## 2013-09-27 NOTE — ED Notes (Signed)
PT  REPORTS  SYMPTOMS  OF  SORETHROAT   AS WELL  AS A  RASH ON  HER  HANDS     WITH  THE  SYMPTOMS  X  3 DAYS        Pt  Also reports  Neck pain  Which she has  Had prior  To these symptoms

## 2013-09-29 LAB — CULTURE, GROUP A STREP

## 2013-10-02 NOTE — ED Provider Notes (Signed)
Medical screening examination/treatment/procedure(s) were performed by a resident physician or non-physician practitioner and as the supervising physician I was immediately available for consultation/collaboration.  Jacque Garrels, MD    Natoya Viscomi S Daymian Lill, MD 10/02/13 0747 

## 2014-01-21 ENCOUNTER — Institutional Professional Consult (permissible substitution): Payer: Self-pay | Admitting: Neurology

## 2014-02-13 ENCOUNTER — Institutional Professional Consult (permissible substitution): Payer: Self-pay | Admitting: Neurology

## 2014-05-02 ENCOUNTER — Encounter (HOSPITAL_COMMUNITY): Payer: Self-pay | Admitting: Emergency Medicine

## 2014-05-02 ENCOUNTER — Emergency Department (HOSPITAL_COMMUNITY): Payer: Medicaid Other

## 2014-05-02 ENCOUNTER — Emergency Department (HOSPITAL_COMMUNITY)
Admission: EM | Admit: 2014-05-02 | Discharge: 2014-05-02 | Disposition: A | Discharge status: Home / Self Care | Payer: Medicaid Other | Attending: Emergency Medicine | Admitting: Emergency Medicine

## 2014-05-02 DIAGNOSIS — S298XXA Other specified injuries of thorax, initial encounter: Secondary | ICD-10-CM | POA: Diagnosis not present

## 2014-05-02 DIAGNOSIS — S0990XA Unspecified injury of head, initial encounter: Secondary | ICD-10-CM | POA: Diagnosis not present

## 2014-05-02 DIAGNOSIS — S139XXA Sprain of joints and ligaments of unspecified parts of neck, initial encounter: Secondary | ICD-10-CM | POA: Diagnosis not present

## 2014-05-02 DIAGNOSIS — IMO0002 Reserved for concepts with insufficient information to code with codable children: Secondary | ICD-10-CM | POA: Insufficient documentation

## 2014-05-02 DIAGNOSIS — E669 Obesity, unspecified: Secondary | ICD-10-CM | POA: Insufficient documentation

## 2014-05-02 DIAGNOSIS — I10 Essential (primary) hypertension: Secondary | ICD-10-CM | POA: Insufficient documentation

## 2014-05-02 DIAGNOSIS — Z8669 Personal history of other diseases of the nervous system and sense organs: Secondary | ICD-10-CM | POA: Diagnosis not present

## 2014-05-02 DIAGNOSIS — R000 Tachycardia, unspecified: Secondary | ICD-10-CM | POA: Insufficient documentation

## 2014-05-02 DIAGNOSIS — M549 Dorsalgia, unspecified: Secondary | ICD-10-CM

## 2014-05-02 DIAGNOSIS — Z8719 Personal history of other diseases of the digestive system: Secondary | ICD-10-CM | POA: Diagnosis not present

## 2014-05-02 DIAGNOSIS — Z8679 Personal history of other diseases of the circulatory system: Secondary | ICD-10-CM | POA: Diagnosis not present

## 2014-05-02 DIAGNOSIS — Y92410 Unspecified street and highway as the place of occurrence of the external cause: Secondary | ICD-10-CM | POA: Insufficient documentation

## 2014-05-02 DIAGNOSIS — Y9389 Activity, other specified: Secondary | ICD-10-CM | POA: Diagnosis not present

## 2014-05-02 DIAGNOSIS — R079 Chest pain, unspecified: Secondary | ICD-10-CM

## 2014-05-02 DIAGNOSIS — R51 Headache: Secondary | ICD-10-CM

## 2014-05-02 DIAGNOSIS — R0602 Shortness of breath: Secondary | ICD-10-CM | POA: Insufficient documentation

## 2014-05-02 DIAGNOSIS — S161XXA Strain of muscle, fascia and tendon at neck level, initial encounter: Secondary | ICD-10-CM

## 2014-05-02 DIAGNOSIS — R519 Headache, unspecified: Secondary | ICD-10-CM

## 2014-05-02 LAB — CBC WITH DIFFERENTIAL/PLATELET
Basophils Absolute: 0.1 10*3/uL (ref 0.0–0.1)
Basophils Relative: 1 % (ref 0–1)
EOS ABS: 0.1 10*3/uL (ref 0.0–0.7)
EOS PCT: 1 % (ref 0–5)
HEMATOCRIT: 38 % (ref 36.0–46.0)
Hemoglobin: 12.3 g/dL (ref 12.0–15.0)
LYMPHS ABS: 3.3 10*3/uL (ref 0.7–4.0)
LYMPHS PCT: 30 % (ref 12–46)
MCH: 24.1 pg — AB (ref 26.0–34.0)
MCHC: 32.4 g/dL (ref 30.0–36.0)
MCV: 74.5 fL — AB (ref 78.0–100.0)
MONO ABS: 0.5 10*3/uL (ref 0.1–1.0)
Monocytes Relative: 5 % (ref 3–12)
Neutro Abs: 7.1 10*3/uL (ref 1.7–7.7)
Neutrophils Relative %: 64 % (ref 43–77)
PLATELETS: 211 10*3/uL (ref 150–400)
RBC: 5.1 MIL/uL (ref 3.87–5.11)
RDW: 16.5 % — AB (ref 11.5–15.5)
WBC: 11.1 10*3/uL — AB (ref 4.0–10.5)

## 2014-05-02 LAB — BASIC METABOLIC PANEL
BUN: 12 mg/dL (ref 6–23)
CALCIUM: 9.3 mg/dL (ref 8.4–10.5)
CO2: 26 meq/L (ref 19–32)
CREATININE: 0.84 mg/dL (ref 0.50–1.10)
Chloride: 101 mEq/L (ref 96–112)
GFR calc non Af Amer: >90 mL/min (ref >90)
Glucose, Bld: 74 mg/dL (ref 70–99)
Potassium: 3.9 mEq/L (ref 3.7–5.3)
SODIUM: 138 meq/L (ref 137–147)

## 2014-05-02 LAB — I-STAT TROPONIN, ED: TROPONIN I, POC: 0.01 ng/mL (ref 0.00–0.08)

## 2014-05-02 MED ORDER — HYDROCODONE-ACETAMINOPHEN 5-325 MG PO TABS: 1.0000 | ORAL_TABLET | Freq: Four times a day (QID) | ORAL | Status: DC | PRN

## 2014-05-02 MED ORDER — CYCLOBENZAPRINE HCL 10 MG PO TABS: 10.0000 mg | ORAL_TABLET | Freq: Two times a day (BID) | ORAL | Status: DC | PRN

## 2014-05-02 MED ORDER — HYDROCODONE-ACETAMINOPHEN 5-325 MG PO TABS
2.0000 | ORAL_TABLET | Freq: Once | ORAL | Status: AC
  Administered 2014-05-02: 2 via ORAL
  Filled 2014-05-02: qty 2

## 2014-05-02 MED ORDER — CYCLOBENZAPRINE HCL 10 MG PO TABS
5.0000 mg | ORAL_TABLET | Freq: Once | ORAL | Status: AC
  Administered 2014-05-02: 5 mg via ORAL
  Filled 2014-05-02: qty 1
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
## 2014-05-02 NOTE — ED Notes (Signed)
Pt states she has a ride home. 
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
## 2014-10-21 ENCOUNTER — Encounter (HOSPITAL_COMMUNITY): Payer: Self-pay | Admitting: Emergency Medicine

## 2015-06-06 ENCOUNTER — Ambulatory Visit (HOSPITAL_BASED_OUTPATIENT_CLINIC_OR_DEPARTMENT_OTHER): Payer: Medicaid Other | Attending: Internal Medicine | Admitting: *Deleted

## 2015-06-06 VITALS — Ht 68.0 in | Wt 310.0 lb

## 2015-06-06 DIAGNOSIS — R0683 Snoring: Secondary | ICD-10-CM | POA: Diagnosis not present

## 2015-06-06 DIAGNOSIS — G4710 Hypersomnia, unspecified: Secondary | ICD-10-CM | POA: Diagnosis present

## 2015-06-06 DIAGNOSIS — G4733 Obstructive sleep apnea (adult) (pediatric): Secondary | ICD-10-CM | POA: Insufficient documentation

## 2015-06-06 DIAGNOSIS — G4730 Sleep apnea, unspecified: Secondary | ICD-10-CM

## 2015-06-07 ENCOUNTER — Ambulatory Visit: Payer: Medicaid Other | Admitting: Internal Medicine

## 2015-06-07 ENCOUNTER — Ambulatory Visit (HOSPITAL_BASED_OUTPATIENT_CLINIC_OR_DEPARTMENT_OTHER): Payer: Medicaid Other | Admitting: Internal Medicine

## 2015-06-07 DIAGNOSIS — G4730 Sleep apnea, unspecified: Secondary | ICD-10-CM

## 2015-06-07 NOTE — Sleep Study (Signed)
   NAME: Jaime Riggs DATE OF BIRTH:  Mar 07, 1981 MEDICAL RECORD NUMBER [id number]  LOCATION: [facility]  PHYSICIAN: Saeed Toren D  DATE OF STUDY: 06/06/2015  SLEEP STUDY TYPE: Nocturnal Polysomnogram               REFERRING PHYSICIAN: Nolene Ebbs, MD  INDICATION FOR STUDY: Hypersomnia with sleep apnea  EPWORTH SLEEPINESS SCORE:   19/24 HEIGHT: 5\' 8"  (172.7 cm)  WEIGHT: (!) 310 lb (140.615 kg)    Body mass index is 47.15 kg/(m^2).  NECK SIZE: 15.75 in.  MEDICATIONS: Charted for review  SLEEP ARCHITECTURE: Split study protocol. During the diagnostic phase, total sleep time 129.5 minutes with sleep efficiency 90.9%. Stage I was 1.9%, stage II 76.4%, stage III 1.2%, REM 20.5% of total sleep time. Sleep latency 0 minutes, REM latency 3.5 minutes. Awake after sleep onset 5 minutes, arousal index 26.4, bedtime medication: None  RESPIRATORY DATA: Apnea hypopnea index (AHI) 37.5 per hour. 81 total events scored including 32 obstructive apneas and 49 hypopneas. All events were recorded nonsupine. REM AHI 77.0 per hour. CPAP was titrated to 11 CWP, AHI 4.0 per hour. She wore a medium fullface mask.  OXYGEN DATA: Moderate snoring before CPAP with oxygen desaturation to a nadir of 68% on room air. With CPAP control, snoring was prevented and mean oxygen saturation was 96.5%.  CARDIAC DATA: Sinus rhythm with occasional PAC  MOVEMENT/PARASOMNIA: No significant movement disturbance, bathroom 1  IMPRESSION/ RECOMMENDATION:   1) Severe obstructive sleep apnea/hypopnea syndrome, AHI 37.5 per hour. All sleep and events were nonsupine (patient reported vertigo if lying supine). Moderate snoring with oxygen desaturation to a nadir of 68% on room air. 2) Successful CPAP titration to 11 CWP, AHI 4.0 per hour. She wore a medium F&P Simplus fullface mask with heated humidifier. Snoring was prevented and mean oxygen saturation was 96.5%.   Deneise Lever Diplomate, American  Board of Sleep Medicine  ELECTRONICALLY SIGNED ON:  06/07/2015, 12:04 PM [place] PH: [phone]   FX: [phone] [place]

## 2017-03-13 ENCOUNTER — Ambulatory Visit (HOSPITAL_COMMUNITY)
Admission: EM | Admit: 2017-03-13 | Discharge: 2017-03-13 | Disposition: A | Discharge status: Home / Self Care | Payer: Medicaid Other | Attending: Family Medicine | Admitting: Family Medicine

## 2017-03-13 ENCOUNTER — Encounter (HOSPITAL_COMMUNITY): Payer: Self-pay | Admitting: *Deleted

## 2017-03-13 DIAGNOSIS — K029 Dental caries, unspecified: Secondary | ICD-10-CM | POA: Diagnosis not present

## 2017-03-13 DIAGNOSIS — R519 Headache, unspecified: Secondary | ICD-10-CM

## 2017-03-13 DIAGNOSIS — R51 Headache: Secondary | ICD-10-CM

## 2017-03-13 DIAGNOSIS — K047 Periapical abscess without sinus: Secondary | ICD-10-CM

## 2017-03-13 HISTORY — DX: Unspecified asthma, uncomplicated: J45.909

## 2017-03-13 MED ORDER — HYDROCODONE-ACETAMINOPHEN 7.5-325 MG PO TABS: 1.0000 | ORAL_TABLET | ORAL | 0 refills | Status: DC | PRN

## 2017-03-13 MED ORDER — AMOXICILLIN-POT CLAVULANATE 875-125 MG PO TABS: 1.0000 | ORAL_TABLET | Freq: Two times a day (BID) | ORAL | 0 refills | Status: DC

## 2017-03-13 NOTE — ED Triage Notes (Signed)
C/O sinus pressure, pain, generalized toothache x 3 days.  Today pain was significant with pt in tears at home.  Has felt feverish.  Taking IBU.

## 2017-03-13 NOTE — Discharge Instructions (Signed)
The area of swelling and pain is somewhat atypical however I believe it is likely coming from your teeth. He probably have an infection associated with it. You are prescribed an antibiotic and pain medicine. With this pain medicine he can also take ibuprofen and they worked better together. Try to see a dentist as soon as possible.

## 2017-03-13 NOTE — ED Provider Notes (Signed)
CSN: [id number]     Arrival date & time 03/13/17  1546 History   First MD Initiated Contact with Patient 03/13/17 1729     Chief Complaint  Patient presents with  . Facial Pain   (Consider location/radiation/quality/duration/timing/severity/associated sxs/prior Treatment) 36 year old obese female complaining of facial pain for 3 days. She states that her primary site of pain is in the right cheek across the frontal teeth to the left out of the face. Most of the pain is on the right side. There is puffiness and mild swelling to the right cheek over the zygoma and opposite the gingiva.      Past Medical History:  Diagnosis Date  . Asthma   . Epilepsy ([place])    last episode  two yrs.  Marland Kitchen GERD (gastroesophageal reflux disease)   . Migraines   . Neck mass   . Obesity (BMI 30-39.9) 03/27/2012  . Shortness of breath    with preg with multiple only  . Sleep apnea    Past Surgical History:  Procedure Laterality Date  . CESAREAN SECTION    . CESAREAN SECTION  12/04/2012   Procedure: CESAREAN SECTION;  Surgeon: Logan Bores, MD;  Location: [facility];  Service: Obstetrics;  Laterality: N/A;  repeat c/s-TWINS  . MOUTH SURGERY    . NECK MASS EXCISION  04/04/2012   pathology c/w lipoma   Family History  Problem Relation Age of Onset  . Cancer Maternal Aunt     unknown  . Cancer Maternal Grandfather     lung  . Cancer Paternal Grandmother     ovarian  . Other Neg Hx    Social History  Substance Use Topics  . Smoking status: Never Smoker  . Smokeless tobacco: Not on file  . Alcohol use No   OB History    Gravida Para Term Preterm AB Living   2 2 2     3    SAB TAB Ectopic Multiple Live Births         1 2     Review of Systems  Constitutional: Positive for activity change, fatigue and fever.  HENT: Positive for dental problem. Negative for congestion and rhinorrhea.   Eyes: Negative.   Respiratory: Negative.   Skin: Negative.   Neurological: Negative.   All other systems  reviewed and are negative.   Allergies  Patient has no known allergies.  Home Medications   Prior to Admission medications   Medication Sig Start Date End Date Taking? Authorizing Provider  aspirin EC 81 MG tablet Take 81 mg by mouth daily.   Yes Historical Provider, MD  CETIRIZINE HCL PO Take by mouth.   Yes Historical Provider, MD  Phendimetrazine Tartrate 35 MG TABS Take by mouth.   Yes Historical Provider, MD  UNKNOWN TO PATIENT BCPs   Yes Historical Provider, MD  amoxicillin-clavulanate (AUGMENTIN) 875-125 MG tablet Take 1 tablet by mouth every 12 (twelve) hours. 03/13/17   Janne Napoleon, NP  HYDROcodone-acetaminophen (NORCO) 7.5-325 MG tablet Take 1 tablet by mouth every 4 (four) hours as needed. 03/13/17   Janne Napoleon, NP   Meds Ordered and Administered this Visit  Medications - No data to display  BP 122/75   Pulse 87   Temp 98.9 F (37.2 C) (Oral)   Resp 20   SpO2 100%  No data found.   Physical Exam  Constitutional: She is oriented to person, place, and time. She appears well-developed and well-nourished. No distress.  HENT:  Head: Normocephalic and atraumatic.  Mouth/Throat: Oropharynx is clear and moist.  No dental tenderness. Poor dentition with several cavernous teeth in the upper right side. The gingiva is red and mildly swollen but not tender. Could not isolate a particular tooth that was tender or the cause of the swelling and pain. The salivary meatus within the buccal mucosa was not swollen and no drainage and no drainage with compression. 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  Neck: Neck supple.  There is soreness, and tenderness to the right sternocleidomastoid muscle  Pulmonary/Chest: Effort normal.  Neurological: She is alert and oriented to person, place, and time.  Skin: Skin is warm and dry.  Nursing note and vitals reviewed.   Urgent Care Course     Procedures (including critical care time)  Labs Review Labs Reviewed -  No data to display  Imaging Review No results found.   Visual Acuity Review  Right Eye Distance:   Left Eye Distance:   Bilateral Distance:    Right Eye Near:   Left Eye Near:    Bilateral Near:         MDM   1. Right sided facial pain   2. Dental caries   3. Dental infection    The area of swelling and pain is somewhat atypical however I believe it is likely coming from your teeth. You probably have an infection associated with it. You are prescribed an antibiotic and pain medicine. With this pain medicine you can also take ibuprofen and they worked better together. Try to see a dentist as soon as possible. Meds ordered this encounter  Medications  . UNKNOWN TO PATIENT    Sig: BCPs  . aspirin EC 81 MG tablet    Sig: Take 81 mg by mouth daily.  Marland Kitchen CETIRIZINE HCL PO    Sig: Take by mouth.  . Phendimetrazine Tartrate 35 MG TABS    Sig: Take by mouth.  Marland Kitchen HYDROcodone-acetaminophen (NORCO) 7.5-325 MG tablet    Sig: Take 1 tablet by mouth every 4 (four) hours as needed.    Dispense:  15 tablet    Refill:  0    Order Specific Question:   Supervising Provider    Answer:   Robyn Haber [[id number]]  . amoxicillin-clavulanate (AUGMENTIN) 875-125 MG tablet    Sig: Take 1 tablet by mouth every 12 (twelve) hours.    Dispense:  14 tablet    Refill:  0    Order Specific Question:   Supervising Provider    Answer:   Robyn Haber [[id number]]   I have verbally reviewed the discharge instructions with the patient. A printed AVS was given to the patient.  All questions were answered prior to discharge.      Janne Napoleon, NP 03/13/17 1758

## 2017-04-22 ENCOUNTER — Ambulatory Visit (HOSPITAL_COMMUNITY)
Admission: EM | Admit: 2017-04-22 | Discharge: 2017-04-22 | Disposition: A | Discharge status: Home / Self Care | Payer: Medicaid Other | Attending: Family Medicine | Admitting: Family Medicine

## 2017-04-22 ENCOUNTER — Encounter (HOSPITAL_COMMUNITY): Payer: Self-pay | Admitting: Emergency Medicine

## 2017-04-22 DIAGNOSIS — G44211 Episodic tension-type headache, intractable: Secondary | ICD-10-CM

## 2017-04-22 DIAGNOSIS — G44309 Post-traumatic headache, unspecified, not intractable: Secondary | ICD-10-CM | POA: Diagnosis not present

## 2017-04-22 DIAGNOSIS — S8000XA Contusion of unspecified knee, initial encounter: Secondary | ICD-10-CM

## 2017-04-22 DIAGNOSIS — W19XXXA Unspecified fall, initial encounter: Secondary | ICD-10-CM | POA: Diagnosis not present

## 2017-04-22 DIAGNOSIS — S39012A Strain of muscle, fascia and tendon of lower back, initial encounter: Secondary | ICD-10-CM | POA: Diagnosis not present

## 2017-04-22 DIAGNOSIS — S8010XA Contusion of unspecified lower leg, initial encounter: Secondary | ICD-10-CM

## 2017-04-22 MED ORDER — KETOROLAC TROMETHAMINE 60 MG/2ML IM SOLN
INTRAMUSCULAR | Status: AC
  Filled 2017-04-22: qty 2

## 2017-04-22 MED ORDER — CYCLOBENZAPRINE HCL 10 MG PO TABS: 10.0000 mg | ORAL_TABLET | Freq: Two times a day (BID) | ORAL | 0 refills | Status: DC | PRN

## 2017-04-22 MED ORDER — KETOROLAC TROMETHAMINE 60 MG/2ML IM SOLN
60.0000 mg | Freq: Once | INTRAMUSCULAR | Status: AC
  Administered 2017-04-22: 60 mg via INTRAMUSCULAR

## 2017-04-22 MED ORDER — NAPROXEN 500 MG PO TABS: 500.0000 mg | ORAL_TABLET | Freq: Two times a day (BID) | ORAL | 0 refills | Status: DC

## 2017-04-22 NOTE — ED Triage Notes (Signed)
The patient presented to the [facility] with a complaint of bilateral arm, back, bilateral leg, and neck pain secondary to a fall that occurred today.

## 2017-04-22 NOTE — ED Provider Notes (Signed)
CSN: [id number]     Arrival date & time 04/22/17  1920 History   None    Chief Complaint  Patient presents with  . Fall   (Consider location/radiation/quality/duration/timing/severity/associated sxs/prior Treatment) Patient c/o back and arm and leg and headache from fall today   The history is provided by the patient.  Headache  Pain location:  Generalized Radiates to:  Does not radiate Severity currently:  5/10 Severity at highest:  5/10 Onset quality:  Sudden Duration:  1 day Timing:  Constant Progression:  Worsening Relieved by:  Nothing Worsened by:  Nothing Associated symptoms: back pain and neck pain     Past Medical History:  Diagnosis Date  . Asthma   . Epilepsy ([place])    last episode  two yrs.  Marland Kitchen GERD (gastroesophageal reflux disease)   . Migraines   . Neck mass   . Obesity (BMI 30-39.9) 03/27/2012  . Shortness of breath    with preg with multiple only  . Sleep apnea    Past Surgical History:  Procedure Laterality Date  . CESAREAN SECTION    . CESAREAN SECTION  12/04/2012   Procedure: CESAREAN SECTION;  Surgeon: Logan Bores, MD;  Location: [facility];  Service: Obstetrics;  Laterality: N/A;  repeat c/s-TWINS  . MOUTH SURGERY    . NECK MASS EXCISION  04/04/2012   pathology c/w lipoma   Family History  Problem Relation Age of Onset  . Cancer Maternal Aunt     unknown  . Cancer Maternal Grandfather     lung  . Cancer Paternal Grandmother     ovarian  . Other Neg Hx    Social History  Substance Use Topics  . Smoking status: Never Smoker  . Smokeless tobacco: Not on file  . Alcohol use No   OB History    Gravida Para Term Preterm AB Living   2 2 2     3    SAB TAB Ectopic Multiple Live Births         1 2     Review of Systems  Constitutional: Negative.   Eyes: Negative.   Respiratory: Negative.   Cardiovascular: Negative.   Gastrointestinal: Negative.   Endocrine: Negative.   Genitourinary: Negative.   Musculoskeletal: Positive for back  pain and neck pain.  Allergic/Immunologic: Negative.   Neurological: Positive for headaches.    Allergies  Patient has no known allergies.  Home Medications   Prior to Admission medications   Medication Sig Start Date End Date Taking? Authorizing Provider  CETIRIZINE HCL PO Take by mouth.   Yes Historical Provider, MD  cyclobenzaprine (FLEXERIL) 10 MG tablet Take 1 tablet (10 mg total) by mouth 2 (two) times daily as needed for muscle spasms. 04/22/17   Lysbeth Penner, FNP  naproxen (NAPROSYN) 500 MG tablet Take 1 tablet (500 mg total) by mouth 2 (two) times daily with a meal. 04/22/17   Lysbeth Penner, FNP   Meds Ordered and Administered this Visit   Medications  ketorolac (TORADOL) injection 60 mg (60 mg Intramuscular Given 04/22/17 2029)    BP (!) 159/90 (BP Location: Right Arm)   Pulse (!) 101   Temp 98.6 F (37 C) (Oral)   Resp 20   SpO2 99%  No data found.   Physical Exam  Constitutional: She is oriented to person, place, and time. She appears well-developed and well-nourished.  HENT:  Head: Normocephalic.  Right Ear: External ear normal.  Left Ear: External ear normal.  Mouth/Throat:  Oropharynx is clear and moist.  Eyes: Conjunctivae and EOM are normal. Pupils are equal, round, and reactive to light.  Neck: Normal range of motion. Neck supple.  Cardiovascular: Normal rate, regular rhythm and normal heart sounds.   Pulmonary/Chest: Effort normal and breath sounds normal.  Abdominal: Soft. Bowel sounds are normal.  Musculoskeletal: She exhibits tenderness.  TTP lumbar paraspinous muscles.  TTP cervical paraspinous muscles.  Neurological: She is alert and oriented to person, place, and time.  Nursing note and vitals reviewed.   Urgent Care Course     Procedures (including critical care time)  Labs Review Labs Reviewed - No data to display  Imaging Review No results found.   Visual Acuity Review  Right Eye Distance:   Left Eye Distance:   Bilateral  Distance:    Right Eye Near:   Left Eye Near:    Bilateral Near:         MDM   1. Fall, initial encounter   2. Intractable episodic tension-type headache   3. Contusion of knee, unspecified laterality, initial encounter   4. Strain of lumbar region, initial encounter    Naprosyn Cyclobenzaprine     Lysbeth Penner, FNP 04/22/17 2159

## 2017-04-29 ENCOUNTER — Other Ambulatory Visit: Payer: Self-pay | Admitting: General Surgery

## 2017-04-29 DIAGNOSIS — G4733 Obstructive sleep apnea (adult) (pediatric): Secondary | ICD-10-CM

## 2017-04-29 DIAGNOSIS — K219 Gastro-esophageal reflux disease without esophagitis: Secondary | ICD-10-CM

## 2017-04-29 DIAGNOSIS — Z9989 Dependence on other enabling machines and devices: Secondary | ICD-10-CM

## 2017-05-06 ENCOUNTER — Other Ambulatory Visit: Payer: Medicaid Other

## 2017-05-13 ENCOUNTER — Ambulatory Visit
Admission: RE | Admit: 2017-05-13 | Discharge: 2017-05-13 | Disposition: A | Discharge status: Home / Self Care | Payer: Medicaid Other | Source: Ambulatory Visit | Attending: General Surgery | Admitting: General Surgery

## 2017-05-13 ENCOUNTER — Other Ambulatory Visit: Payer: Self-pay | Admitting: General Surgery

## 2017-05-13 DIAGNOSIS — K219 Gastro-esophageal reflux disease without esophagitis: Secondary | ICD-10-CM

## 2017-05-13 DIAGNOSIS — G4733 Obstructive sleep apnea (adult) (pediatric): Secondary | ICD-10-CM

## 2017-05-13 DIAGNOSIS — Z9989 Dependence on other enabling machines and devices: Secondary | ICD-10-CM

## 2018-02-14 ENCOUNTER — Encounter (HOSPITAL_COMMUNITY): Payer: Self-pay | Admitting: Emergency Medicine

## 2018-02-14 ENCOUNTER — Other Ambulatory Visit: Payer: Self-pay

## 2018-02-14 ENCOUNTER — Ambulatory Visit (HOSPITAL_COMMUNITY)
Admission: EM | Admit: 2018-02-14 | Discharge: 2018-02-14 | Disposition: A | Discharge status: Home / Self Care | Payer: Medicaid Other | Attending: Family Medicine | Admitting: Family Medicine

## 2018-02-14 DIAGNOSIS — J029 Acute pharyngitis, unspecified: Secondary | ICD-10-CM

## 2018-02-14 NOTE — Discharge Instructions (Signed)
You may use over the counter ibuprofen or acetaminophen as needed.  °For a sore throat, over the counter products such as [employer] Peroxyl Mouth Sore Rinse or Chloraseptic Sore Throat Spray may provide some temporary relief. ° ° ° ° °

## 2018-02-14 NOTE — ED Triage Notes (Signed)
Pt reports a sore throat and fatigue starting last night.  She said she was sleeping and heard a "crunch" that woke her up.  She felt pain in the left side of her neck and throat after that.

## 2018-02-15 NOTE — ED Provider Notes (Signed)
  Jaime Riggs   [id number] 02/14/18 Arrival Time: [place]:  1. Sore throat    Suspect viral etiology. OTC analgesics and throat care as needed May f/u if not improving over the next few days.  Reviewed expectations re: course of current medical issues. Questions answered. Outlined signs and symptoms indicating need for more acute intervention. Patient verbalized understanding. After Visit Summary given.   SUBJECTIVE:  Jaime Riggs is a 37 y.o. female who reports a sore throat. Describes as discomfort with swallowing. Onset abrupt beginning early this morning. No respiratory symptoms. Normal PO intake but reports discomfort with swallowing. Fever reported: no. No associated n/v/abdominal symptoms. Sick contacts: none.  OTC treatment: none.  ROS: As per HPI.   OBJECTIVE:  Vitals:   02/14/18 1927  BP: (!) 114/56  Pulse: 79  Temp: 98.5 F (36.9 C)  TempSrc: Oral  SpO2: 100%     General appearance: alert; no distress HEENT: throat with mild erythema; uvula midline Neck: supple with FROM; no cervical LAD, non-tender Lungs: clear to auscultation bilaterally Skin: reveals no rash; warm and dry Psychological: alert and cooperative; normal mood and affect  No Known Allergies  Past Medical History:  Diagnosis Date  . Asthma   . Epilepsy ([place])    last episode  two yrs.  Marland Kitchen GERD (gastroesophageal reflux disease)   . Migraines   . Neck mass   . Obesity (BMI 30-39.9) 03/27/2012  . Shortness of breath    with preg with multiple only  . Sleep apnea    Social History   Socioeconomic History  . Marital status: Married    Spouse name: Not on file  . Number of children: Not on file  . Years of education: Not on file  . Highest education level: Not on file  Social Needs  . Financial resource strain: Not on file  . Food insecurity - worry: Not on file  . Food insecurity - inability: Not on file  . Transportation needs - medical: Not on file   . Transportation needs - non-medical: Not on file  Occupational History  . Not on file  Tobacco Use  . Smoking status: Never Smoker  . Smokeless tobacco: Never Used  Substance and Sexual Activity  . Alcohol use: No  . Drug use: No  . Sexual activity: Not on file  Other Topics Concern  . Not on file  Social History Narrative  . Not on file   Family History  Problem Relation Age of Onset  . Cancer Maternal Aunt        unknown  . Cancer Maternal Grandfather        lung  . Cancer Paternal Grandmother        ovarian  . Other Neg Hx           Vanessa Kick, MD 02/15/18 0930

## 2018-02-23 ENCOUNTER — Other Ambulatory Visit: Payer: Self-pay

## 2018-02-23 ENCOUNTER — Encounter (HOSPITAL_COMMUNITY): Payer: Self-pay | Admitting: Emergency Medicine

## 2018-02-23 ENCOUNTER — Ambulatory Visit (HOSPITAL_COMMUNITY)
Admission: EM | Admit: 2018-02-23 | Discharge: 2018-02-23 | Disposition: A | Discharge status: Home / Self Care | Payer: Medicaid Other | Attending: Family Medicine | Admitting: Family Medicine

## 2018-02-23 DIAGNOSIS — S01332A Puncture wound without foreign body of left ear, initial encounter: Secondary | ICD-10-CM | POA: Diagnosis not present

## 2018-02-23 MED ORDER — MUPIROCIN 2 % EX OINT: 1.0000 "application " | TOPICAL_OINTMENT | Freq: Two times a day (BID) | CUTANEOUS | 0 refills | Status: DC

## 2018-02-23 NOTE — ED Triage Notes (Signed)
Pt states she pierced her L upper ear back during thanksgiving and now states shes noticedt he last week its had redness, drainage and bleeding to the site.

## 2018-02-27 NOTE — ED Provider Notes (Signed)
  [place]   [id number] 02/23/18 Arrival Time: 9735  ASSESSMENT & PLAN:  1. Complication of left ear piercing, initial encounter    Meds ordered this encounter  Medications  . mupirocin ointment (BACTROBAN) 2 %    Sig: Place 1 application into the nose 2 (two) times daily.    Dispense:  22 g    Refill:  0   Will f/u with PCP or here if not seeing improvement over the next few days. Possibility of contact dermatitis secondary to piercing discussed. May need to remove and replace.  Reviewed expectations re: course of current medical issues. Questions answered. Outlined signs and symptoms indicating need for more acute intervention. Patient verbalized understanding. After Visit Summary given.   SUBJECTIVE: History from: patient.  Jaime Riggs is a 37 y.o. female who presents with complaint of left ear irritation/discomfort where piercing present. New piercing done about 2 months ago. Since then, reports intermittent skin redness with occasional crusting. No specific drainage. Afebrile. Once or twice has seen some bleeding.  Social History   Tobacco Use  Smoking Status Never Smoker  Smokeless Tobacco Never Used    ROS: As per HPI.   OBJECTIVE:  Vitals:   02/23/18 1841  BP: (!) 151/83  Pulse: 88  Resp: 16  Temp: 98.6 F (37 C)  SpO2: 100%    General appearance: alert; appears fatigued Ear Canal: normal TM: bilateral: normal Neck: supple without LAD Skin: warm and dry; over superior external L ear is a bar piercing with mild skin erythema at insertion points; mild tenderness; no swelling Psychological: alert and cooperative; normal mood and affect  No Known Allergies  Past Medical History:  Diagnosis Date  . Asthma   . Epilepsy ([place])    last episode  two yrs.  Marland Kitchen GERD (gastroesophageal reflux disease)   . Migraines   . Neck mass   . Obesity (BMI 30-39.9) 03/27/2012  . Shortness of breath    with preg with multiple only  . Sleep apnea     Family History  Problem Relation Age of Onset  . Cancer Maternal Aunt        unknown  . Cancer Maternal Grandfather        lung  . Cancer Paternal Grandmother        ovarian  . Other Neg Hx    Social History   Socioeconomic History  . Marital status: Married    Spouse name: Not on file  . Number of children: Not on file  . Years of education: Not on file  . Highest education level: Not on file  Social Needs  . Financial resource strain: Not on file  . Food insecurity - worry: Not on file  . Food insecurity - inability: Not on file  . Transportation needs - medical: Not on file  . Transportation needs - non-medical: Not on file  Occupational History  . Not on file  Tobacco Use  . Smoking status: Never Smoker  . Smokeless tobacco: Never Used  Substance and Sexual Activity  . Alcohol use: No  . Drug use: No  . Sexual activity: Not on file  Other Topics Concern  . Not on file  Social History Narrative  . Not on file            Vanessa Kick, MD 02/27/18 [phone]

## 2018-05-30 ENCOUNTER — Other Ambulatory Visit: Payer: Self-pay

## 2018-05-30 ENCOUNTER — Encounter (HOSPITAL_COMMUNITY): Payer: Self-pay | Admitting: Emergency Medicine

## 2018-05-30 ENCOUNTER — Ambulatory Visit (HOSPITAL_COMMUNITY)
Admission: EM | Admit: 2018-05-30 | Discharge: 2018-05-30 | Disposition: A | Discharge status: Home / Self Care | Payer: Medicaid Other | Attending: Family Medicine | Admitting: Family Medicine

## 2018-05-30 DIAGNOSIS — R51 Headache: Secondary | ICD-10-CM | POA: Diagnosis not present

## 2018-05-30 DIAGNOSIS — M542 Cervicalgia: Secondary | ICD-10-CM | POA: Diagnosis not present

## 2018-05-30 DIAGNOSIS — M5489 Other dorsalgia: Secondary | ICD-10-CM

## 2018-05-30 DIAGNOSIS — R6884 Jaw pain: Secondary | ICD-10-CM

## 2018-05-30 MED ORDER — CYCLOBENZAPRINE HCL 5 MG PO TABS: 5.0000 mg | ORAL_TABLET | Freq: Every evening | ORAL | 0 refills | Status: AC | PRN

## 2018-05-30 MED ORDER — KETOROLAC TROMETHAMINE 60 MG/2ML IM SOLN
INTRAMUSCULAR | Status: AC
  Filled 2018-05-30: qty 2

## 2018-05-30 MED ORDER — KETOROLAC TROMETHAMINE 60 MG/2ML IM SOLN
60.0000 mg | Freq: Once | INTRAMUSCULAR | Status: AC
  Administered 2018-05-30: 60 mg via INTRAMUSCULAR
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
## 2018-11-04 ENCOUNTER — Ambulatory Visit (HOSPITAL_COMMUNITY)
Admission: EM | Admit: 2018-11-04 | Discharge: 2018-11-04 | Disposition: A | Discharge status: Home / Self Care | Payer: Medicaid Other | Attending: Family Medicine | Admitting: Family Medicine

## 2018-11-04 ENCOUNTER — Encounter (HOSPITAL_COMMUNITY): Payer: Self-pay

## 2018-11-04 ENCOUNTER — Ambulatory Visit (INDEPENDENT_AMBULATORY_CARE_PROVIDER_SITE_OTHER): Payer: Medicaid Other

## 2018-11-04 DIAGNOSIS — M25561 Pain in right knee: Secondary | ICD-10-CM | POA: Diagnosis not present

## 2018-11-04 MED ORDER — MELOXICAM 7.5 MG PO TABS: 7.5000 mg | ORAL_TABLET | Freq: Every day | ORAL | 0 refills | Status: DC

## 2018-11-04 NOTE — ED Provider Notes (Signed)
[place]    CSN: [id number] Arrival date & time: 11/04/18  1035     History   Chief Complaint Chief Complaint  Patient presents with  . Knee Pain    HPI Jaime Riggs is a 37 y.o. female.   She is a 37 year old female who presents for right knee pain/swelling.  She reports that this started 2 days ago.  She reached over to pick up a 25 pound child and felt a pop in her right knee.  The knee almost gave out on her.  Since that she has had pain with ambulation, and she feels like her knee is "not working right".  When she goes from a sitting to standing position it is very painful.  She denies any numbness, tingling, loss of sensation, radiation of pain.  She denies any posterior knee pain.   ROS per HPI      Past Medical History:  Diagnosis Date  . Asthma   . Epilepsy ([place])    last episode  two yrs.  Marland Kitchen GERD (gastroesophageal reflux disease)   . Migraines   . Neck mass   . Obesity (BMI 30-39.9) 03/27/2012  . Shortness of breath    with preg with multiple only  . Sleep apnea     Patient Active Problem List   Diagnosis Date Noted  . Lipoma of posterior neck/upper back 12x9x6cm 03/27/2012  . Obesity, Class III, BMI 40-49.9 (morbid obesity) ([place]) 03/27/2012    Past Surgical History:  Procedure Laterality Date  . CESAREAN SECTION    . CESAREAN SECTION  12/04/2012   Procedure: CESAREAN SECTION;  Surgeon: Logan Bores, MD;  Location: [facility];  Service: Obstetrics;  Laterality: N/A;  repeat c/s-TWINS  . MOUTH SURGERY    . NECK MASS EXCISION  04/04/2012   pathology c/w lipoma    OB History    Gravida  2   Para  2   Term  2   Preterm      AB      Living  3     SAB      TAB      Ectopic      Multiple  1   Live Births  2            Home Medications    Prior to Admission medications   Medication Sig Start Date End Date Taking? Authorizing Provider  CETIRIZINE HCL PO Take by mouth.    [provider]    cyclobenzaprine (FLEXERIL) 5 MG tablet Take 1 tablet (5 mg total) by mouth at bedtime as needed for muscle spasms. 05/30/18   Tasia Catchings, Amy V, PA-C  meloxicam (MOBIC) 7.5 MG tablet Take 1 tablet (7.5 mg total) by mouth daily. 11/04/18   Orvan July, NP    Family History Family History  Problem Relation Age of Onset  . Cancer Maternal Aunt        unknown  . Cancer Maternal Grandfather        lung  . Cancer Paternal Grandmother        ovarian  . Other Neg Hx     Social History Social History   Tobacco Use  . Smoking status: Never Smoker  . Smokeless tobacco: Never Used  Substance Use Topics  . Alcohol use: No  . Drug use: No     Allergies   Patient has no known allergies.   Review of Systems Review of Systems   Physical Exam Triage  Vital Signs ED Triage Vitals  Enc Vitals Group     BP 11/04/18 1122 120/65     Pulse Rate 11/04/18 1122 90     Resp 11/04/18 1122 20     Temp 11/04/18 1122 98.3 F (36.8 C)     Temp src --      SpO2 11/04/18 1122 100 %     Weight --      Height --      Head Circumference --      Peak Flow --      Pain Score 11/04/18 1120 5     Pain Loc --      Pain Edu? --      Excl. in [place]? --    No data found.  Updated Vital Signs BP 120/65   Pulse 90   Temp 98.3 F (36.8 C)   Resp 20   LMP 11/04/2018   SpO2 100%   Visual Acuity Right Eye Distance:   Left Eye Distance:   Bilateral Distance:    Right Eye Near:   Left Eye Near:    Bilateral Near:     Physical Exam  Constitutional: She is oriented to person, place, and time. She appears well-developed and well-nourished.  HENT:  Head: Normocephalic and atraumatic.  Eyes: Conjunctivae are normal.  Neck: Normal range of motion.  Pulmonary/Chest: Effort normal.  Musculoskeletal: She exhibits edema and tenderness. She exhibits no deformity.  Mild tenderness and swelling to the medial aspect of right knee.  Some tenderness superior to patella.  Patient only able to do passive range of  motion with flexion of the knee  Patient having to use upper body to support when going from sitting to standing position. Mild laxity with valgus and varus Patient somewhat hard to examine due to body habitus  Neurological: She is alert and oriented to person, place, and time.  Skin: Skin is warm and dry.  Psychiatric: She has a normal mood and affect.  Nursing note and vitals reviewed.    UC Treatments / Results  Labs (all labs ordered are listed, but only abnormal results are displayed) Labs Reviewed - No data to display  EKG None  Radiology Dg Knee Complete 4 Views Right  Result Date: 11/04/2018 CLINICAL DATA:  Right knee pain EXAM: RIGHT KNEE - COMPLETE 4+ VIEW COMPARISON:  None. FINDINGS: No fracture or dislocation is seen. The joint spaces are preserved. Visualized soft tissues are within normal limits. No suprapatellar knee joint effusion. IMPRESSION: Negative. Electronically Signed   By: Julian Hy M.D.   On: 11/04/2018 11:56    Procedures Procedures (including critical care time)  Medications Ordered in UC Medications - No data to display  Initial Impression / Assessment and Plan / UC Course  I have reviewed the triage vital signs and the nursing notes.  Pertinent labs & imaging results that were available during my care of the patient were reviewed by me and considered in my medical decision making (see chart for details).     Patient is a 38 year old female presents for knee pain. X-ray did not reveal any abnormalities. Most likely some sort of ligamentous injury Will have her use a knee immobilizer with crutches Meloxicam for pain inflammation RICE If her pain continues she will need to follow-up with orthopedic for further management Final Clinical Impressions(s) / UC Diagnoses   Final diagnoses:  Acute pain of right knee     Discharge Instructions     X-ray did not reveal any  abnormalities Most likely this is some kind of ligamentous  problem We will place you in a knee immobilizer and give you some crutches to stay off of the knee Meloxicam for pain and inflammation Make sure you are resting, elevating and icing the knee For worsening symptoms or no improvement please follow-up with orthopedic.    ED Prescriptions    Medication Sig Dispense Auth. Provider   meloxicam (MOBIC) 7.5 MG tablet Take 1 tablet (7.5 mg total) by mouth daily. 15 tablet Loura Halt A, NP     Controlled Substance Prescriptions [place] Controlled Substance Registry consulted? Not Applicable   Orvan July, NP 11/04/18 1233

## 2018-11-04 NOTE — Discharge Instructions (Signed)
X-ray did not reveal any abnormalities Most likely this is some kind of ligamentous problem We will place you in a knee immobilizer and give you some crutches to stay off of the knee Meloxicam for pain and inflammation Make sure you are resting, elevating and icing the knee For worsening symptoms or no improvement please follow-up with orthopedic.

## 2018-11-04 NOTE — ED Notes (Signed)
Pt discharged by provider.

## 2018-11-04 NOTE — ED Triage Notes (Signed)
Pt presents with complaints of right knee pain. Ambulatory. States that her knee gave out while holding a 25 pound baby and has had increasing pain since.

## 2019-07-31 ENCOUNTER — Other Ambulatory Visit: Payer: Self-pay | Admitting: Internal Medicine

## 2019-07-31 DIAGNOSIS — N6320 Unspecified lump in the left breast, unspecified quadrant: Secondary | ICD-10-CM

## 2019-08-07 ENCOUNTER — Ambulatory Visit
Admission: RE | Admit: 2019-08-07 | Discharge: 2019-08-07 | Disposition: A | Discharge status: Home / Self Care | Payer: Medicaid Other | Source: Ambulatory Visit | Attending: Internal Medicine | Admitting: Internal Medicine

## 2019-08-07 ENCOUNTER — Other Ambulatory Visit: Payer: Self-pay

## 2019-08-07 DIAGNOSIS — N6320 Unspecified lump in the left breast, unspecified quadrant: Secondary | ICD-10-CM

## 2019-10-04 ENCOUNTER — Encounter: Payer: Self-pay | Admitting: Physician Assistant

## 2019-10-04 ENCOUNTER — Other Ambulatory Visit: Payer: Self-pay

## 2019-10-04 ENCOUNTER — Ambulatory Visit (INDEPENDENT_AMBULATORY_CARE_PROVIDER_SITE_OTHER): Payer: Medicaid Other | Admitting: Physician Assistant

## 2019-10-04 ENCOUNTER — Ambulatory Visit: Payer: Self-pay

## 2019-10-04 ENCOUNTER — Ambulatory Visit (INDEPENDENT_AMBULATORY_CARE_PROVIDER_SITE_OTHER): Payer: Medicaid Other

## 2019-10-04 DIAGNOSIS — M25561 Pain in right knee: Secondary | ICD-10-CM | POA: Diagnosis not present

## 2019-10-04 DIAGNOSIS — G8929 Other chronic pain: Secondary | ICD-10-CM

## 2019-10-04 DIAGNOSIS — M25562 Pain in left knee: Secondary | ICD-10-CM

## 2019-10-04 MED ORDER — LIDOCAINE HCL 1 % IJ SOLN
3.0000 mL | INTRAMUSCULAR | Status: AC | PRN
  Administered 2019-10-04: 17:00:00 3 mL

## 2019-10-04 MED ORDER — METHYLPREDNISOLONE ACETATE 40 MG/ML IJ SUSP
40.0000 mg | INTRAMUSCULAR | Status: AC | PRN
  Administered 2019-10-04: 17:00:00 40 mg via INTRA_ARTICULAR

## 2019-10-04 NOTE — Progress Notes (Signed)
Office Visit Note   Patient: Jaime Riggs           Date of Birth: [birth year]           MRN: [id number] Visit Date: 10/04/2019              Requested by: Nolene Ebbs, MD [address] [place],  [place] [address] PCP: Nolene Ebbs, MD   Assessment & Plan: Visit Diagnoses:  1. Chronic pain of right knee   2. Chronic pain of left knee     Plan: Discussed with her quad strengthening.  We will send her to formal physical therapy to work on home exercise program modalities and quad strengthening.  Quad strengthening exercises are shown to patient.  Discussed with her weight loss and how this would be beneficial.  Discussed knee friendly exercises.  Spoke to her about either taking ibuprofen or Mobic but not both.  She will follow up with [place] in 6 weeks to check her response to these conservative measures.  Follow-Up Instructions: Return in about 6 weeks (around 11/15/2019).   Orders:  Orders Placed This Encounter  Procedures  . Large Joint Inj  . XR Knee 1-2 Views Right  . XR Knee 1-2 Views Left   No orders of the defined types were placed in this encounter.     Procedures: Large Joint Inj: L knee on 10/04/2019 5:12 PM Indications: pain Details: 22 G 1.5 in needle, anterolateral approach  Arthrogram: No  Medications: 3 mL lidocaine 1 %; 40 mg methylPREDNISolone acetate 40 MG/ML Outcome: tolerated well, no immediate complications Procedure, treatment alternatives, risks and benefits explained, specific risks discussed. Consent was given by the patient. Immediately prior to procedure a time out was called to verify the correct patient, procedure, equipment, support staff and site/side marked as required. Patient was prepped and draped in the usual sterile fashion.       Clinical Data: No additional findings.   Subjective: Chief Complaint  Patient presents with  . Left Knee - Pain  . Right Knee - Pain    HPI Jaime Riggs is a 38 year old female comes in today  with bilateral knee pain.  She states pains became worse over the last year.  She has constant pain in both knees but is worse in the left knee.  She notes a crunching-like sensation in her left knee over the past year.  She does report earlier last year she had an incident where she was holding the abdomen lost her balance but did not fall on that knee she felt a pop in her knee at times.  She was given a knee brace was told to follow-up with orthopedics if things did not get better in 3 to 4 months however Covid epidemic began at that point time she did not seek any medical treatment.  States she has a stabbing pain in the medial aspect of both knees at times.  Knees do give way particularly left knee.  Otherwise no mechanical symptoms of either knee.  She takes ibuprofen and Flexeril for the pain which helps.  Occasionally take some Mobic for breakthrough pain.  Reports that she is working on weight loss was due to undergo gastric bypass in January.  She has lost some 15 to 20 pounds.  Review of Systems Denies any chest pain shortness breath fever chills  Objective: Vital Signs: There were no vitals taken for this visit.  Physical Exam Constitutional:      Appearance: She is not ill-appearing  or diaphoretic.  Pulmonary:     Effort: Pulmonary effort is normal.  Neurological:     Mental Status: She is alert and oriented to person, place, and time.  Psychiatric:        Mood and Affect: Mood normal.     Ortho Exam She has good range of motion patellofemoral crepitus.  Both knees hyperextend.  No instability valgus varus stressing of either knee.  Tenderness medial joint line of both knees.  McMurray's negative bilaterally.  No abnormal warmth erythema or effusion of either knee.  Specialty Comments:  No specialty comments available.  Imaging: Xr Knee 1-2 Views Left  Result Date: 10/04/2019 Left knee no acute fractures.  Knee is well located.  Medial joint line narrowing mild to  moderate.  Mild patellofemoral changes.  Xr Knee 1-2 Views Right  Result Date: 10/04/2019 Right knee well located.  No acute fracture.  Mild joint line narrowing medially.  Mild patellofemoral changes.  Lateral joint lines well-maintained.    PMFS History: Patient Active Problem List   Diagnosis Date Noted  . Lipoma of posterior neck/upper back 12x9x6cm 03/27/2012  . Obesity, Class III, BMI 40-49.9 (morbid obesity) ([place]) 03/27/2012   Past Medical History:  Diagnosis Date  . Asthma   . Epilepsy ([place])    last episode  two yrs.  Marland Kitchen GERD (gastroesophageal reflux disease)   . Migraines   . Neck mass   . Obesity (BMI 30-39.9) 03/27/2012  . Shortness of breath    with preg with multiple only  . Sleep apnea     Family History  Problem Relation Age of Onset  . Cancer Maternal Aunt        unknown  . Cancer Maternal Grandfather        lung  . Cancer Paternal Grandmother        ovarian  . Other Neg Hx     Past Surgical History:  Procedure Laterality Date  . CESAREAN SECTION    . CESAREAN SECTION  12/04/2012   Procedure: CESAREAN SECTION;  Surgeon: Logan Bores, MD;  Location: [facility];  Service: Obstetrics;  Laterality: N/A;  repeat c/s-TWINS  . MOUTH SURGERY    . NECK MASS EXCISION  04/04/2012   pathology c/w lipoma   Social History   Occupational History  . Not on file  Tobacco Use  . Smoking status: Never Smoker  . Smokeless tobacco: Never Used  Substance and Sexual Activity  . Alcohol use: No  . Drug use: No  . Sexual activity: Not on file

## 2019-10-05 ENCOUNTER — Other Ambulatory Visit: Payer: Self-pay

## 2019-10-05 DIAGNOSIS — M25562 Pain in left knee: Secondary | ICD-10-CM

## 2019-10-05 DIAGNOSIS — G8929 Other chronic pain: Secondary | ICD-10-CM

## 2019-10-24 ENCOUNTER — Ambulatory Visit: Payer: Medicaid Other | Admitting: Physical Therapy

## 2019-11-08 ENCOUNTER — Other Ambulatory Visit: Payer: Self-pay

## 2019-11-08 ENCOUNTER — Ambulatory Visit: Payer: Medicaid Other | Attending: Physician Assistant | Admitting: Physical Therapy

## 2019-11-08 ENCOUNTER — Encounter: Payer: Self-pay | Admitting: Physical Therapy

## 2019-11-08 DIAGNOSIS — M25662 Stiffness of left knee, not elsewhere classified: Secondary | ICD-10-CM | POA: Diagnosis present

## 2019-11-08 DIAGNOSIS — M25562 Pain in left knee: Secondary | ICD-10-CM | POA: Diagnosis not present

## 2019-11-08 DIAGNOSIS — R2689 Other abnormalities of gait and mobility: Secondary | ICD-10-CM | POA: Diagnosis present

## 2019-11-08 DIAGNOSIS — M25561 Pain in right knee: Secondary | ICD-10-CM | POA: Insufficient documentation

## 2019-11-08 DIAGNOSIS — G8929 Other chronic pain: Secondary | ICD-10-CM | POA: Insufficient documentation

## 2019-11-09 ENCOUNTER — Encounter: Payer: Self-pay | Admitting: Physical Therapy

## 2019-11-09 NOTE — Addendum Note (Signed)
Addended by: Carney Living on: 11/09/2019 09:07 AM   Modules accepted: Orders

## 2019-11-09 NOTE — Therapy (Signed)
[place], [place], [address] Phone: [phone]   Fax:  [phone]  Physical Therapy Evaluation  Patient Details  Name: Jaime Riggs MRN: [id number] Date of Birth: [birth year] Referring Provider (PT): Erskine Emery PA    Encounter Date: 11/08/2019  PT End of Session - 11/08/19 1435    Visit Number  1    Number of Visits  4    Date for PT Re-Evaluation  12/06/19    Authorization Type  Mediciad    PT Start Time  1332    PT Stop Time  1417    PT Time Calculation (min)  45 min    Activity Tolerance  Patient tolerated treatment well    Behavior During Therapy  [facility] for tasks assessed/performed       Past Medical History:  Diagnosis Date  . Asthma   . Epilepsy ([place])    last episode  two yrs.  Marland Kitchen GERD (gastroesophageal reflux disease)   . Migraines   . Neck mass   . Obesity (BMI 30-39.9) 03/27/2012  . Shortness of breath    with preg with multiple only  . Sleep apnea     Past Surgical History:  Procedure Laterality Date  . CESAREAN SECTION    . CESAREAN SECTION  12/04/2012   Procedure: CESAREAN SECTION;  Surgeon: Logan Bores, MD;  Location: [facility];  Service: Obstetrics;  Laterality: N/A;  repeat c/s-TWINS  . MOUTH SURGERY    . NECK MASS EXCISION  04/04/2012   pathology c/w lipoma    There were no vitals filed for this visit.   Subjective Assessment - 11/08/19 1341    Subjective  Patient has a long histry of pain in multiple joints. She is here today for her knees but has pain in both hands and pain in her neck and jaw. The left knee is the worst and gives out on her at times. She has had pain for 7 years.    How long can you sit comfortably?  aching while sitting    How long can you walk comfortably?  agitation with standing and walking    Diagnostic tests  LeftX-ray: milt to moderate degeneration Right x-ray: mild degeneration    Currently in Pain?  Yes    Pain Score  5     Pain  Location  Knee    Pain Orientation  Left    Pain Descriptors / Indicators  Aching;Sharp;Shooting    Pain Type  Chronic pain    Pain Onset  More than a month ago    Pain Frequency  Constant    Aggravating Factors   Standing walking, sometimes sitting    Pain Relieving Factors  rubbing the knee    Multiple Pain Sites  Yes    Pain Score  4    Pain Location  Knee    Pain Orientation  Right    Pain Descriptors / Indicators  Aching    Pain Type  Chronic pain    Pain Radiating Towards  difficulty perfroming ADl's    Pain Onset  More than a month ago    Pain Frequency  Constant    Aggravating Factors   Standing and walking, sitting at times    Pain Relieving Factors  rubbing the knee    Effect of Pain on Daily Activities  difficulty perfroming ADl;s         [facility] PT Assessment - 11/09/19 0001  Assessment   Medical Diagnosis  Bilateral knee pain     Referring Provider (PT)  Erskine Emery PA     Onset Date/Surgical Date  --   7 years prior    Hand Dominance  Right    Next MD Visit  Decemeber 3rd     Prior Therapy  None       Precautions   Precautions  None      Restrictions   Weight Bearing Restrictions  No      Balance Screen   Has the patient fallen in the past 6 months  Yes    How many times?  1    Has the patient had a decrease in activity level because of a fear of falling?   Yes    Is the patient reluctant to leave their home because of a fear of falling?   Yes      [place] residence    Home Access  Stairs to enter    Entrance Stairs-Number of Steps  4    Additional Comments  painful going up and down the steps      Prior Function   Level of Independence  Independent    Vocation  Unemployed    Leisure  run, plaly basketball       Cognition   Overall Cognitive Status  Within Functional Limits for tasks assessed    Attention  Focused    Focused Attention  Appears intact    Memory  Appears intact    Awareness  Appears  intact    Problem Solving  Appears intact      Observation/Other Assessments   Observations  walks with bilateral pronation but arches fair in standing       Sensation   Light Touch  Appears Intact    Additional Comments  denies parathesias in legs, numbness in the hands ; numbness in the shoulders and arms       Coordination   Gross Motor Movements are Fluid and Coordinated  Yes    Fine Motor Movements are Fluid and Coordinated  Yes      Posture/Postural Control   Posture Comments  rounded shoulders, forward head; sits in posterior pelvic tilt       AROM   Overall AROM Comments  pain with active left extension       PROM   Overall PROM Comments  pain with active end range flexion       Strength   Right Hip Flexion  3/5    Right Hip ABduction  3/5    Left Hip Flexion  3/5    Left Hip ADduction  3/5      Ambulation/Gait   Gait Comments  very littel hip flexion at all with gait; keeps knees stiff and abducts hips when walking;  Her legs are both locked out despite her having pain with full extension. She takes small steps. She has bilateral foot pronation.                 Objective measurements completed on examination: See above findings.      [facility] Adult PT Treatment/Exercise - 11/09/19 0001      Knee/Hip Exercises: Stretches   Other Knee/Hip Stretches  reviewed self patella mobilization       Knee/Hip Exercises: Seated   Other Seated Knee/Hip Exercises  supine clam shell yellow 2x10; quad set bilateral x10 moderate cuing for technique  Manual Therapy   Manual therapy comments  tired taping but patient couldnt get her pants up over her knees              PT Education - 11/08/19 1432    Education Details  HEP and symptom mangement; progression of activity    Person(s) Educated  Patient    Methods  Explanation;Demonstration;Tactile cues;Verbal cues    Comprehension  Verbalized understanding;Returned demonstration;Tactile cues required        PT Short Term Goals - 11/09/19 0748      PT SHORT TERM GOAL #1   Title  Patient will be inpdepdnet with a basic HEP    Baseline  No HEP    Time  4    Period  Weeks    Status  New    Target Date  12/07/19      PT SHORT TERM GOAL #2   Title  Patient will demonstrate full left extension without pain    Baseline  signficant pain with end range extension    Time  4    Period  Weeks    Status  New    Target Date  12/07/19      PT SHORT TERM GOAL #3   Title  Patient will increase gross bilateral lower extremity strength to 4/5    Baseline  3/5 bilateral flexion 3/5 bilateral knee extension 3+/5 bilateral hip abuction    Time  4    Period  Weeks    Status  New    Target Date  12/07/19                Plan - 11/08/19 1436    Clinical Impression Statement  Patient is a 38 year old female with bilateral knee pain L>R. She presents with a significant antalgic gait. She walsk with little to no hip flexion and with abduction which is likley contributing to her knee pain. She has limited movement of her patella and limited knee strength. She has pain with full extension of her left knee depsite ambulating in full extension. She would benefit from a strengthening program as well as gait training.    Personal Factors and Comorbidities  Comorbidity 1;Comorbidity 2;Time since onset of injury/illness/exacerbation    Comorbidities  obesity, OA    Examination-Activity Limitations  Squat;Stairs;Sit;Transfers;Stand    Examination-Participation Restrictions  Cleaning;Meal Prep;Driving;Community Activity;Shop    Stability/Clinical Decision Making  Evolving/Moderate complexity    Clinical Decision Making  Moderate    Rehab Potential  Excellent    PT Frequency  1x / week    PT Duration  4 weeks    PT Treatment/Interventions  ADLs/Self Care Home Management;Cryotherapy;Electrical Stimulation;Iontophoresis 4mg /ml Dexamethasone;Moist Heat;Ultrasound;DME Instruction;Gait training;Stair  training;Functional mobility training;Therapeutic activities;Therapeutic exercise;Neuromuscular re-education;Patient/family education;Manual techniques;Passive range of motion    PT Next Visit Plan  tape botlh knees, review how to tape. Continue to encourage movement; gait training with a cane; knee extension stretching on the left; Patient is motivated but needs to be encouraged to move.    PT Home Exercise Plan  qaud sets; patella self mobilization, supine clam shell.    Consulted and Agree with Plan of Care  Patient       Patient will benefit from skilled therapeutic intervention in order to improve the following deficits and impairments:  Decreased strength, Decreased activity tolerance, Decreased endurance, Decreased range of motion, Impaired UE functional use, Pain, Obesity, Improper body mechanics, Difficulty walking, Impaired perceived functional ability, Decreased balance  Visit Diagnosis: Chronic pain  of left knee  Chronic pain of right knee  Other abnormalities of gait and mobility  Stiffness of left knee, not elsewhere classified     Problem List Patient Active Problem List   Diagnosis Date Noted  . Lipoma of posterior neck/upper back 12x9x6cm 03/27/2012  . Obesity, Class III, BMI 40-49.9 (morbid obesity) ([place]) 03/27/2012    Carney Living  PT DPT  11/09/2019, 9:04 AM  [facility] [address] [place], [place], [address] Phone: [phone]   Fax:  [phone]  Name: Jaime Riggs MRN: [id number] Date of Birth: 29-Jul-1981

## 2019-11-16 ENCOUNTER — Other Ambulatory Visit: Payer: Self-pay

## 2019-11-16 ENCOUNTER — Encounter (HOSPITAL_COMMUNITY): Payer: Self-pay

## 2019-11-16 ENCOUNTER — Ambulatory Visit (HOSPITAL_COMMUNITY)
Admission: EM | Admit: 2019-11-16 | Discharge: 2019-11-16 | Disposition: A | Discharge status: Home / Self Care | Payer: Medicaid Other

## 2019-11-16 NOTE — ED Triage Notes (Addendum)
Pt states she started feeling dizzy last night after she had dinner. Pt states she feels the room is spinning when she stand up. Pt reports her BP was sitting 164/101, standing  170/110 this morning. Pt think maybe the salt on the food from last night raised her BP.

## 2019-11-21 ENCOUNTER — Encounter: Payer: Self-pay | Admitting: Physical Therapy

## 2019-11-21 ENCOUNTER — Ambulatory Visit: Payer: Medicaid Other | Attending: Physician Assistant | Admitting: Physical Therapy

## 2019-11-21 ENCOUNTER — Other Ambulatory Visit: Payer: Self-pay

## 2019-11-21 DIAGNOSIS — R2689 Other abnormalities of gait and mobility: Secondary | ICD-10-CM | POA: Diagnosis present

## 2019-11-21 DIAGNOSIS — G8929 Other chronic pain: Secondary | ICD-10-CM

## 2019-11-21 DIAGNOSIS — M25561 Pain in right knee: Secondary | ICD-10-CM | POA: Insufficient documentation

## 2019-11-21 DIAGNOSIS — M25562 Pain in left knee: Secondary | ICD-10-CM | POA: Diagnosis not present

## 2019-11-21 DIAGNOSIS — M25662 Stiffness of left knee, not elsewhere classified: Secondary | ICD-10-CM

## 2019-11-22 ENCOUNTER — Encounter: Payer: Self-pay | Admitting: Physical Therapy

## 2019-11-22 ENCOUNTER — Ambulatory Visit (INDEPENDENT_AMBULATORY_CARE_PROVIDER_SITE_OTHER): Payer: Medicaid Other | Admitting: Physician Assistant

## 2019-11-22 ENCOUNTER — Encounter: Payer: Self-pay | Admitting: Physician Assistant

## 2019-11-22 DIAGNOSIS — M25561 Pain in right knee: Secondary | ICD-10-CM

## 2019-11-22 DIAGNOSIS — G8929 Other chronic pain: Secondary | ICD-10-CM

## 2019-11-22 MED ORDER — METHYLPREDNISOLONE ACETATE 40 MG/ML IJ SUSP
40.0000 mg | INTRAMUSCULAR | Status: AC | PRN
  Administered 2019-11-22: 40 mg via INTRA_ARTICULAR

## 2019-11-22 MED ORDER — LIDOCAINE HCL 1 % IJ SOLN
3.0000 mL | INTRAMUSCULAR | Status: AC | PRN
  Administered 2019-11-22: 3 mL

## 2019-11-22 NOTE — Progress Notes (Signed)
   Procedure Note  Patient: Jaime Riggs             Date of Birth: 12-19-1981           MRN: [id number]             Visit Date: 11/22/2019 Jaime Riggs returns today follow-up of her bilateral knee pain.  She states that the left knee injection gave her good relief for over a month.  She is wondering if she can have injections both knees today.  She has been working with physical therapy and notes that her overall strength and range of motion of them proved.  She states she is not having as much clicking and popping in both knees.  She is scheduled to undergo gastric surgery sometime in February.  She continues to work on weight loss.  Physical exam: Bilateral knees no effusion abnormal warmth erythema.  She hyperextends both knees.  Minimal patellofemoral crepitus both knees.  Tenderness along medial joint line of both knees.  No instability valgus varus stressing of either knee. Procedures: Visit Diagnoses:  1. Chronic pain of right knee     Large Joint Inj: R knee on 11/22/2019 2:27 PM Indications: pain Details: 22 G 1.5 in needle, anterolateral approach  Arthrogram: No  Medications: 3 mL lidocaine 1 %; 40 mg methylPREDNISolone acetate 40 MG/ML Outcome: tolerated well, no immediate complications Procedure, treatment alternatives, risks and benefits explained, specific risks discussed. Consent was given by the patient. Immediately prior to procedure a time out was called to verify the correct patient, procedure, equipment, support staff and site/side marked as required. Patient was prepped and draped in the usual sterile fashion.    Plan: We will have her apply Voltaren gel to both up to 4 times daily.  Recommend she continue to work on weight loss and also strength in both legs.  Follow-up with [place] in January for repeat injection left knee.  Questions are encouraged and answered.

## 2019-11-22 NOTE — Therapy (Signed)
[place], [place], [address] Phone: [phone]   Fax:  [phone]  Physical Therapy Treatment  Patient Details  Name: Jaime Riggs MRN: [id number] Date of Birth: 07-27-1981 Referring Provider (PT): Erskine Emery PA    Encounter Date: 11/21/2019  PT End of Session - 11/21/19 1656    Visit Number  2    Number of Visits  4    Date for PT Re-Evaluation  12/06/19    Authorization Type  [employer]    PT Start Time  1550    PT Stop Time  1630    PT Time Calculation (min)  40 min    Activity Tolerance  Patient tolerated treatment well    Behavior During Therapy  [facility] for tasks assessed/performed       Past Medical History:  Diagnosis Date  . Asthma   . Epilepsy ([place])    last episode  two yrs.  Marland Kitchen GERD (gastroesophageal reflux disease)   . Migraines   . Neck mass   . Obesity (BMI 30-39.9) 03/27/2012  . Shortness of breath    with preg with multiple only  . Sleep apnea     Past Surgical History:  Procedure Laterality Date  . CESAREAN SECTION    . CESAREAN SECTION  12/04/2012   Procedure: CESAREAN SECTION;  Surgeon: Logan Bores, MD;  Location: [facility];  Service: Obstetrics;  Laterality: N/A;  repeat c/s-TWINS  . MOUTH SURGERY    . NECK MASS EXCISION  04/04/2012   pathology c/w lipoma    There were no vitals filed for this visit.  Subjective Assessment - 11/21/19 1555    Subjective  Patient has been doing her exercises and she feels like she is moving a little better.    How long can you sit comfortably?  aching while sitting    How long can you walk comfortably?  agitation with standing and walking    Diagnostic tests  LeftX-ray: milt to moderate degeneration Right x-ray: mild degeneration    Currently in Pain?  Yes    Pain Score  3     Pain Location  Knee    Pain Orientation  Left    Pain Descriptors / Indicators  Aching    Pain Type  Chronic pain    Pain Onset  More than a month ago     Pain Frequency  Constant    Aggravating Factors   standing and walking    Pain Relieving Factors  rubbing her knee    Multiple Pain Sites  No                       [facility] Adult PT Treatment/Exercise - 11/22/19 0001      Knee/Hip Exercises: Stretches   Active Hamstring Stretch Limitations  3x20 sed hold each leg mod cuing for posture and leg position     Gastroc Stretch  3 reps;20 seconds    Gastroc Stretch Limitations  bilateral       Knee/Hip Exercises: Standing   Heel Raises Limitations  2x10 weight shifting forward ( not into full heel raise)     Knee Flexion Limitations  standing march 2x10 with cuing for weight shift.       Knee/Hip Exercises: Seated   Other Seated Knee/Hip Exercises  supine clam shell yellow 2x10;     Other Seated Knee/Hip Exercises  quad set 2x10 5 sec hold     Abd/Adduction Limitations  ball squaeeze with stomach tightening       Manual Therapy   Manual Therapy  Taping;Joint mobilization    Joint Mobilization  inferior and superior patellar mobi.lization bilateral    reviewed self patella mobilization    McConnell  to left knee              PT Education - 11/21/19 1656    Education Details  updated HEP    Person(s) Educated  Patient    Methods  Explanation;Demonstration;Tactile cues;Verbal cues    Comprehension  Verbalized understanding;Returned demonstration;Verbal cues required;Tactile cues required       PT Short Term Goals - 11/21/19 1702      PT SHORT TERM GOAL #1   Title  Patient will be inpdepdnet with a basic HEP    Baseline  perfroming at home    Time  4    Period  Weeks    Status  On-going      PT SHORT TERM GOAL #2   Title  Patient will demonstrate full left extension without pain    Baseline  signficant pain with end range extension    Time  4    Period  Weeks    Status  On-going    Target Date  12/07/19      PT SHORT TERM GOAL #3   Title  Patient will increase gross bilateral lower extremity strength  to 4/5    Baseline  3/5 bilateral flexion 3/5 bilateral knee extension 3+/5 bilateral hip abuction    Time  4    Period  Weeks    Status  On-going    Target Date  12/07/19               Plan - 11/21/19 1658    Clinical Impression Statement  Patient tolerated treatment well. She is very motivated to get better. Therapy trialed taping today. Her pants were tight so therapy was only able to get about half the tape on. She was given a quad set for home. She required mod cuing for technique. she had more difficultyon the left. She was also given a calf and hamstring stretch.    Personal Factors and Comorbidities  Comorbidity 1;Comorbidity 2;Time since onset of injury/illness/exacerbation    Comorbidities  obesity, OA    Examination-Activity Limitations  Squat;Stairs;Sit;Transfers;Stand    Stability/Clinical Decision Making  Evolving/Moderate complexity    Clinical Decision Making  Moderate    Rehab Potential  Excellent    PT Frequency  1x / week    PT Duration  4 weeks    PT Treatment/Interventions  ADLs/Self Care Home Management;Cryotherapy;Electrical Stimulation;Iontophoresis 4mg /ml Dexamethasone;Moist Heat;Ultrasound;DME Instruction;Gait training;Stair training;Functional mobility training;Therapeutic activities;Therapeutic exercise;Neuromuscular re-education;Patient/family education;Manual techniques;Passive range of motion    PT Next Visit Plan  tape botlh knees, review how to tape. Continue to encourage movement; gait training with a cane; knee extension stretching on the left; Patient is motivated but needs to be encouraged to move. progress standing exercises as tolerated ; continue with taping    PT Home Exercise Plan  qaud sets; patella self mobilization, supine clam shell.    Consulted and Agree with Plan of Care  Patient       Patient will benefit from skilled therapeutic intervention in order to improve the following deficits and impairments:  Decreased strength, Decreased  activity tolerance, Decreased endurance, Decreased range of motion, Impaired UE functional use, Pain, Obesity, Improper body mechanics, Difficulty walking, Impaired perceived functional ability, Decreased balance  Visit Diagnosis: Chronic  pain of left knee  Chronic pain of right knee  Other abnormalities of gait and mobility  Stiffness of left knee, not elsewhere classified     Problem List Patient Active Problem List   Diagnosis Date Noted  . Lipoma of posterior neck/upper back 12x9x6cm 03/27/2012  . Obesity, Class III, BMI 40-49.9 (morbid obesity) ([place]) 03/27/2012    Carney Living PT DPT  11/22/2019, 3:19 PM  [facility] [address] [place], [place], [address] Phone: [phone]   Fax:  [phone]  Name: Jaime Riggs MRN: [id number] Date of Birth: 11-19-1981

## 2019-11-27 ENCOUNTER — Other Ambulatory Visit: Payer: Self-pay | Admitting: Neurosurgery

## 2019-11-28 ENCOUNTER — Other Ambulatory Visit: Payer: Self-pay

## 2019-11-28 ENCOUNTER — Ambulatory Visit: Payer: Medicaid Other | Admitting: Physical Therapy

## 2019-11-28 ENCOUNTER — Encounter: Payer: Self-pay | Admitting: Physical Therapy

## 2019-11-28 DIAGNOSIS — M25562 Pain in left knee: Secondary | ICD-10-CM | POA: Diagnosis not present

## 2019-11-28 DIAGNOSIS — R2689 Other abnormalities of gait and mobility: Secondary | ICD-10-CM

## 2019-11-28 DIAGNOSIS — M25662 Stiffness of left knee, not elsewhere classified: Secondary | ICD-10-CM

## 2019-11-28 DIAGNOSIS — M25561 Pain in right knee: Secondary | ICD-10-CM

## 2019-11-29 ENCOUNTER — Encounter: Payer: Self-pay | Admitting: Physical Therapy

## 2019-11-29 NOTE — Therapy (Signed)
[place], [place], [address] Phone: [phone]   Fax:  [phone]  Physical Therapy Treatment  Patient Details  Name: Jaime Riggs MRN: [id number] Date of Birth: [birth year] Referring Provider (PT): Erskine Emery PA    Encounter Date: 11/28/2019  PT End of Session - 11/29/19 0956    Visit Number  3    Number of Visits  4    Date for PT Re-Evaluation  12/06/19    Authorization Type  Mediciad    PT Start Time  1545    PT Stop Time  1628    PT Time Calculation (min)  43 min    Activity Tolerance  Patient tolerated treatment well    Behavior During Therapy  [facility] for tasks assessed/performed       Past Medical History:  Diagnosis Date  . Asthma   . Epilepsy ([place])    last episode  two yrs.  Marland Kitchen GERD (gastroesophageal reflux disease)   . Migraines   . Neck mass   . Obesity (BMI 30-39.9) 03/27/2012  . Shortness of breath    with preg with multiple only  . Sleep apnea     Past Surgical History:  Procedure Laterality Date  . CESAREAN SECTION    . CESAREAN SECTION  12/04/2012   Procedure: CESAREAN SECTION;  Surgeon: Logan Bores, MD;  Location: [facility];  Service: Obstetrics;  Laterality: N/A;  repeat c/s-TWINS  . MOUTH SURGERY    . NECK MASS EXCISION  04/04/2012   pathology c/w lipoma    There were no vitals filed for this visit.  Subjective Assessment - 11/28/19 1558    Subjective  Patient reports her knees are doing better. She has been working on her exercises and feels like they are working better.    How long can you sit comfortably?  aching while sitting    How long can you walk comfortably?  agitation with standing and walking    Diagnostic tests  LeftX-ray: milt to moderate degeneration Right x-ray: mild degeneration    Currently in Pain?  No/denies                       [facility] Adult PT Treatment/Exercise - 11/29/19 0001      Knee/Hip Exercises: Stretches   Active  Hamstring Stretch Limitations  3x20 sed hold each leg mod cuing for posture and leg position     Gastroc Stretch  3 reps;20 seconds    Gastroc Stretch Limitations  bilateral       Knee/Hip Exercises: Aerobic   Nustep  5 min L3 to build endurance       Knee/Hip Exercises: Standing   Heel Raises Limitations  2x10 weight shifting forward ( not into full heel raise)     Knee Flexion Limitations  standing march 2x10 with cuing for weight shift.     Other Standing Knee Exercises  standing hip abduction 2x10; standing hip extension 2x10      Knee/Hip Exercises: Seated   Heel Slides Limitations  heel slide 2x10     Other Seated Knee/Hip Exercises  supine clam shell yellow 2x10;     Abd/Adduction Limitations  ball squaeeze with stomach tightening       Knee/Hip Exercises: Supine   Quad Sets Limitations  2x10 blue     Short Arc Quad Sets Limitations  2x10       Manual Therapy   Joint Mobilization  patella rmobilization  bilateral              PT Education - 11/29/19 0956    Education Details  HEP and symptom mangement    Person(s) Educated  Patient    Methods  Explanation;Demonstration;Tactile cues;Verbal cues    Comprehension  Verbalized understanding;Returned demonstration;Verbal cues required;Tactile cues required       PT Short Term Goals - 11/21/19 1702      PT SHORT TERM GOAL #1   Title  Patient will be inpdepdnet with a basic HEP    Baseline  perfroming at home    Time  4    Period  Weeks    Status  On-going      PT SHORT TERM GOAL #2   Title  Patient will demonstrate full left extension without pain    Baseline  signficant pain with end range extension    Time  4    Period  Weeks    Status  On-going    Target Date  12/07/19      PT SHORT TERM GOAL #3   Title  Patient will increase gross bilateral lower extremity strength to 4/5    Baseline  3/5 bilateral flexion 3/5 bilateral knee extension 3+/5 bilateral hip abuction    Time  4    Period  Weeks    Status   On-going    Target Date  12/07/19               Plan - 11/29/19 1000    Clinical Impression Statement  Thep patient is making great proegress. She has been consitetn with her exercises. She was able to lie on a wedge today to do supine exercises. She tolerated ther-ex well today. Therapy added standing hip abduction and extnesion. She is trying to obtain a script for her neck and TMJ.    Comorbidities  obesity, OA    Examination-Activity Limitations  Squat;Stairs;Sit;Transfers;Stand    Stability/Clinical Decision Making  Evolving/Moderate complexity    Clinical Decision Making  Moderate    Rehab Potential  Excellent    PT Frequency  1x / week    PT Duration  4 weeks    PT Treatment/Interventions  ADLs/Self Care Home Management;Cryotherapy;Electrical Stimulation;Iontophoresis 4mg /ml Dexamethasone;Moist Heat;Ultrasound;DME Instruction;Gait training;Stair training;Functional mobility training;Therapeutic activities;Therapeutic exercise;Neuromuscular re-education;Patient/family education;Manual techniques;Passive range of motion    PT Next Visit Plan  tape botlh knees, review how to tape. Continue to encourage movement; gait training with a cane; knee extension stretching on the left; Patient is motivated but needs to be encouraged to move. progress standing exercises as tolerated ; continue with taping    PT Home Exercise Plan  qaud sets; patella self mobilization, supine clam shell.    Consulted and Agree with Plan of Care  Patient       Patient will benefit from skilled therapeutic intervention in order to improve the following deficits and impairments:  Decreased strength, Decreased activity tolerance, Decreased endurance, Decreased range of motion, Impaired UE functional use, Pain, Obesity, Improper body mechanics, Difficulty walking, Impaired perceived functional ability, Decreased balance  Visit Diagnosis: Chronic pain of left knee  Chronic pain of right knee  Other  abnormalities of gait and mobility  Stiffness of left knee, not elsewhere classified     Problem List Patient Active Problem List   Diagnosis Date Noted  . Lipoma of posterior neck/upper back 12x9x6cm 03/27/2012  . Obesity, Class III, BMI 40-49.9 (morbid obesity) ([place]) 03/27/2012    Carney Living PT DPT  11/29/2019, 10:21 AM  [place], [place], [address] Phone: [phone]   Fax:  [phone]  Name: KENNIE RUPAR MRN: [id number] Date of Birth: Apr 11, 1981

## 2019-12-03 ENCOUNTER — Other Ambulatory Visit (HOSPITAL_COMMUNITY)
Admission: RE | Admit: 2019-12-03 | Discharge: 2019-12-03 | Disposition: A | Discharge status: Home / Self Care | Payer: Medicaid Other | Source: Ambulatory Visit | Attending: Neurosurgery | Admitting: Neurosurgery

## 2019-12-03 DIAGNOSIS — Z20828 Contact with and (suspected) exposure to other viral communicable diseases: Secondary | ICD-10-CM | POA: Insufficient documentation

## 2019-12-03 DIAGNOSIS — Z01812 Encounter for preprocedural laboratory examination: Secondary | ICD-10-CM | POA: Insufficient documentation

## 2019-12-04 ENCOUNTER — Ambulatory Visit: Payer: Medicaid Other | Admitting: Physical Therapy

## 2019-12-04 ENCOUNTER — Other Ambulatory Visit: Payer: Self-pay

## 2019-12-04 DIAGNOSIS — M25662 Stiffness of left knee, not elsewhere classified: Secondary | ICD-10-CM

## 2019-12-04 DIAGNOSIS — M25562 Pain in left knee: Secondary | ICD-10-CM

## 2019-12-04 DIAGNOSIS — G8929 Other chronic pain: Secondary | ICD-10-CM

## 2019-12-04 DIAGNOSIS — M25561 Pain in right knee: Secondary | ICD-10-CM

## 2019-12-04 DIAGNOSIS — R2689 Other abnormalities of gait and mobility: Secondary | ICD-10-CM

## 2019-12-04 LAB — NOVEL CORONAVIRUS, NAA (HOSP ORDER, SEND-OUT TO REF LAB; TAT 18-24 HRS): SARS-CoV-2, NAA: NOT DETECTED

## 2019-12-04 NOTE — Therapy (Signed)
[place], [place], [address] Phone: [phone]   Fax:  [phone]  Physical Therapy Treatment  Patient Details  Name: Jaime Riggs MRN: [id number] Date of Birth: 05/10/1981 Referring Provider (PT): Erskine Emery PA    Encounter Date: 12/04/2019  PT End of Session - 12/04/19 1111    Visit Number  4    Number of Visits  4    Date for PT Re-Evaluation  12/06/19    Authorization Type  Mediciad    PT Start Time  1110   10 minutes late   PT Stop Time  1145    PT Time Calculation (min)  35 min       Past Medical History:  Diagnosis Date  . Asthma   . Epilepsy ([place])    last episode  two yrs.  Marland Kitchen GERD (gastroesophageal reflux disease)   . Migraines   . Neck mass   . Obesity (BMI 30-39.9) 03/27/2012  . Shortness of breath    with preg with multiple only  . Sleep apnea     Past Surgical History:  Procedure Laterality Date  . CESAREAN SECTION    . CESAREAN SECTION  12/04/2012   Procedure: CESAREAN SECTION;  Surgeon: Logan Bores, MD;  Location: [facility];  Service: Obstetrics;  Laterality: N/A;  repeat c/s-TWINS  . MOUTH SURGERY    . NECK MASS EXCISION  04/04/2012   pathology c/w lipoma    There were no vitals filed for this visit.  Subjective Assessment - 12/04/19 1112    Subjective  The right one started hurting more 2 days ago. It was the good leg and not it is bothering me more.    Currently in Pain?  Yes    Pain Score  0-No pain   0/10 on left knee   Pain Location  Knee    Pain Orientation  Left    Pain Descriptors / Indicators  Aching    Pain Type  Chronic pain    Pain Score  6    Pain Location  Knee    Pain Orientation  Right    Pain Descriptors / Indicators  Aching    Aggravating Factors   not sure, did not do anything unusual    Pain Relieving Factors  rubbing the knee, resting         [facility] PT Assessment - 12/04/19 0001      Strength   Right Hip Flexion  3+/5    Left Hip Flexion  3+/5    Right Knee Flexion  3+/5    Left Knee Flexion  3+/5                   [facility] Adult PT Treatment/Exercise - 12/04/19 0001      Knee/Hip Exercises: Supine   Short Arc Quad Sets Limitations  2 x 10 right    Heel Slides  10 reps    Bridges Limitations  gluteal sets     Straight Leg Raises  Right;Left;10 reps    Other Supine Knee/Hip Exercises  green band clam shell x 20       Manual Therapy   Manual therapy comments  IASTM right anterior and lateral thigh     Joint Mobilization  right patella mob mediall              PT Education - 12/04/19 1157    Education Details  HEP    Person(s) Educated  Patient  Methods  Explanation;Handout    Comprehension  Verbalized understanding       PT Short Term Goals - 12/04/19 1140      PT SHORT TERM GOAL #1   Title  Patient will be inpdepdnet with a basic HEP    Baseline  perfroming at home    Time  4    Period  Weeks    Status  Achieved      PT SHORT TERM GOAL #2   Title  Patient will demonstrate full left extension without pain    Time  4    Period  Weeks    Status  Achieved      PT SHORT TERM GOAL #3   Baseline  3/5 bilateral flexion 3/5 bilateral knee extension 3+/5 bilateral hip abuction    Time  4    Period  Weeks    Status  On-going               Plan - 12/04/19 1158    Clinical Impression Statement  Pt is making good progress with pain regarding L knee however notes increased right knee over past two days. IASTM and medial patella gliding used to decrease right knee pain. Began SLR and supine clams and updated HEP. She is still weak in hips and knee. She does have improved left knee AROM into extension without increased pain. She declined taping techniques today. STG# 1, 2 met.    PT Next Visit Plan  tape botlh knees, review how to tape. Continue to encourage movement; gait training with a cane; knee extension stretching on the left; Patient is motivated but needs to be  encouraged to move. progress standing exercises as tolerated ; continue with taping    PT Home Exercise Plan  qaud sets; patella self mobilization, supine clam shell green band and SLR       Patient will benefit from skilled therapeutic intervention in order to improve the following deficits and impairments:  Decreased strength, Decreased activity tolerance, Decreased endurance, Decreased range of motion, Impaired UE functional use, Pain, Obesity, Improper body mechanics, Difficulty walking, Impaired perceived functional ability, Decreased balance  Visit Diagnosis: Chronic pain of left knee  Chronic pain of right knee  Other abnormalities of gait and mobility  Stiffness of left knee, not elsewhere classified     Problem List Patient Active Problem List   Diagnosis Date Noted  . Lipoma of posterior neck/upper back 12x9x6cm 03/27/2012  . Obesity, Class III, BMI 40-49.9 (morbid obesity) ([place]) 03/27/2012    Dorene Ar, PTA 12/04/2019, 12:03 PM  [facility] [address] [place], [place], [address] Phone: [phone]   Fax:  [phone]  Name: ALBERTINA LEISE MRN: [id number] Date of Birth: Dec 26, 1980

## 2019-12-05 ENCOUNTER — Other Ambulatory Visit: Payer: Self-pay

## 2019-12-05 ENCOUNTER — Ambulatory Visit: Payer: Medicaid Other | Admitting: Physical Therapy

## 2019-12-05 ENCOUNTER — Encounter (HOSPITAL_COMMUNITY): Payer: Self-pay | Admitting: Neurosurgery

## 2019-12-06 ENCOUNTER — Ambulatory Visit (HOSPITAL_COMMUNITY): Payer: Medicaid Other | Admitting: Physician Assistant

## 2019-12-06 ENCOUNTER — Encounter (HOSPITAL_COMMUNITY): Payer: Self-pay | Admitting: Neurosurgery

## 2019-12-06 ENCOUNTER — Ambulatory Visit (HOSPITAL_COMMUNITY)
Admission: RE | Admit: 2019-12-06 | Discharge: 2019-12-06 | Disposition: A | Discharge status: Home / Self Care | Payer: Medicaid Other | Attending: Neurosurgery | Admitting: Neurosurgery

## 2019-12-06 ENCOUNTER — Encounter (HOSPITAL_COMMUNITY)
Admission: RE | Disposition: A | Discharge status: Home / Self Care | Payer: Self-pay | Source: Home / Self Care | Attending: Neurosurgery

## 2019-12-06 DIAGNOSIS — G5602 Carpal tunnel syndrome, left upper limb: Secondary | ICD-10-CM | POA: Insufficient documentation

## 2019-12-06 DIAGNOSIS — T884XXA Failed or difficult intubation, initial encounter: Secondary | ICD-10-CM

## 2019-12-06 HISTORY — DX: Type 2 diabetes mellitus without complications: E11.9

## 2019-12-06 HISTORY — PX: CARPAL TUNNEL RELEASE: SHX101

## 2019-12-06 HISTORY — DX: Nausea with vomiting, unspecified: R11.2

## 2019-12-06 HISTORY — DX: Other specified postprocedural states: Z98.890

## 2019-12-06 HISTORY — DX: Failed or difficult intubation, initial encounter: T88.4XXA

## 2019-12-06 LAB — BASIC METABOLIC PANEL
Anion gap: 10 (ref 5–15)
BUN: 9 mg/dL (ref 6–20)
CO2: 22 mmol/L (ref 22–32)
Calcium: 9 mg/dL (ref 8.9–10.3)
Chloride: 106 mmol/L (ref 98–111)
Creatinine, Ser: 0.79 mg/dL (ref 0.44–1.00)
GFR calc Af Amer: >60 mL/min (ref >60)
GFR calc non Af Amer: >60 mL/min (ref >60)
Glucose, Bld: 70 mg/dL (ref 70–99)
Potassium: 5.1 mmol/L (ref 3.5–5.1)
Sodium: 138 mmol/L (ref 135–145)

## 2019-12-06 LAB — GLUCOSE, CAPILLARY
Glucose-Capillary: 71 mg/dL (ref 70–99)
Glucose-Capillary: 78 mg/dL (ref 70–99)
Glucose-Capillary: 69 mg/dL — ABNORMAL LOW (ref 70–99)
Glucose-Capillary: 93 mg/dL (ref 70–99)
Glucose-Capillary: 71 mg/dL (ref 70–99)
Glucose-Capillary: 87 mg/dL (ref 70–99)

## 2019-12-06 LAB — CBC
HCT: 41.2 % (ref 36.0–46.0)
Hemoglobin: 12.8 g/dL (ref 12.0–15.0)
MCH: 23.5 pg — ABNORMAL LOW (ref 26.0–34.0)
MCHC: 31.1 g/dL (ref 30.0–36.0)
MCV: 75.7 fL — ABNORMAL LOW (ref 80.0–100.0)
Platelets: 193 10*3/uL (ref 150–400)
RBC: 5.44 MIL/uL — ABNORMAL HIGH (ref 3.87–5.11)
RDW: 18 % — ABNORMAL HIGH (ref 11.5–15.5)
WBC: 11.7 10*3/uL — ABNORMAL HIGH (ref 4.0–10.5)
nRBC: 0 % (ref 0.0–0.2)

## 2019-12-06 LAB — POCT PREGNANCY, URINE: Preg Test, Ur: NEGATIVE

## 2019-12-06 SURGERY — CARPAL TUNNEL RELEASE
Anesthesia: General | Laterality: Left

## 2019-12-06 MED ORDER — LACTATED RINGERS IV SOLN: INTRAVENOUS | Status: DC

## 2019-12-06 MED ORDER — DEXAMETHASONE SODIUM PHOSPHATE 10 MG/ML IJ SOLN
INTRAMUSCULAR | Status: DC | PRN
  Administered 2019-12-06: 10 mg via INTRAVENOUS

## 2019-12-06 MED ORDER — CEFAZOLIN SODIUM-DEXTROSE 2-4 GM/100ML-% IV SOLN: 2.0000 g | INTRAVENOUS | Status: DC

## 2019-12-06 MED ORDER — SCOPOLAMINE 1 MG/3DAYS TD PT72
MEDICATED_PATCH | TRANSDERMAL | Status: AC
  Administered 2019-12-06: 15:00:00 1.5 mg via TRANSDERMAL
  Filled 2019-12-06: qty 1

## 2019-12-06 MED ORDER — SUCCINYLCHOLINE CHLORIDE 200 MG/10ML IV SOSY
PREFILLED_SYRINGE | INTRAVENOUS | Status: AC
  Filled 2019-12-06: qty 10

## 2019-12-06 MED ORDER — ACETAMINOPHEN 500 MG PO TABS
ORAL_TABLET | ORAL | Status: AC
  Administered 2019-12-06: 1000 mg via ORAL
  Filled 2019-12-06: qty 2

## 2019-12-06 MED ORDER — SCOPOLAMINE 1 MG/3DAYS TD PT72: 1.0000 | MEDICATED_PATCH | TRANSDERMAL | Status: DC

## 2019-12-06 MED ORDER — DEXTROSE 50 % IV SOLN
12.5000 mL | Freq: Once | INTRAVENOUS | Status: AC
  Filled 2019-12-06: qty 50

## 2019-12-06 MED ORDER — LIDOCAINE 2% (20 MG/ML) 5 ML SYRINGE
INTRAMUSCULAR | Status: AC
  Filled 2019-12-06: qty 5

## 2019-12-06 MED ORDER — SUGAMMADEX SODIUM 200 MG/2ML IV SOLN
INTRAVENOUS | Status: DC | PRN
  Administered 2019-12-06: 200 mg via INTRAVENOUS

## 2019-12-06 MED ORDER — ROCURONIUM BROMIDE 10 MG/ML (PF) SYRINGE
PREFILLED_SYRINGE | INTRAVENOUS | Status: AC
  Filled 2019-12-06: qty 10

## 2019-12-06 MED ORDER — HYDROMORPHONE HCL 1 MG/ML IJ SOLN: 0.2500 mg | INTRAMUSCULAR | Status: DC | PRN

## 2019-12-06 MED ORDER — MIDAZOLAM HCL 2 MG/2ML IJ SOLN
INTRAMUSCULAR | Status: AC
  Filled 2019-12-06: qty 2

## 2019-12-06 MED ORDER — DEXTROSE 5 % IV SOLN
INTRAVENOUS | Status: DC | PRN
  Administered 2019-12-06: 18:00:00 3 g via INTRAVENOUS

## 2019-12-06 MED ORDER — GLYCOPYRROLATE 0.2 MG/ML IJ SOLN
INTRAMUSCULAR | Status: DC | PRN
  Administered 2019-12-06: .2 mg via INTRAVENOUS

## 2019-12-06 MED ORDER — ACETAMINOPHEN 500 MG PO TABS: 1000.0000 mg | ORAL_TABLET | Freq: Once | ORAL | Status: AC

## 2019-12-06 MED ORDER — PROPOFOL 10 MG/ML IV BOLUS
INTRAVENOUS | Status: DC | PRN
  Administered 2019-12-06: 100 mg via INTRAVENOUS
  Administered 2019-12-06: 200 mg via INTRAVENOUS

## 2019-12-06 MED ORDER — FENTANYL CITRATE (PF) 250 MCG/5ML IJ SOLN
INTRAMUSCULAR | Status: AC
  Filled 2019-12-06: qty 5

## 2019-12-06 MED ORDER — BACITRACIN ZINC 500 UNIT/GM EX OINT
TOPICAL_OINTMENT | CUTANEOUS | Status: AC
  Filled 2019-12-06: qty 28.35

## 2019-12-06 MED ORDER — 0.9 % SODIUM CHLORIDE (POUR BTL) OPTIME
TOPICAL | Status: DC | PRN
  Administered 2019-12-06: 19:00:00 1000 mL

## 2019-12-06 MED ORDER — CHLORHEXIDINE GLUCONATE CLOTH 2 % EX PADS: 6.0000 | MEDICATED_PAD | Freq: Once | CUTANEOUS | Status: DC

## 2019-12-06 MED ORDER — ROCURONIUM BROMIDE 50 MG/5ML IV SOSY
PREFILLED_SYRINGE | INTRAVENOUS | Status: DC | PRN
  Administered 2019-12-06: 40 mg via INTRAVENOUS

## 2019-12-06 MED ORDER — DEXTROSE 50 % IV SOLN
25.0000 mL | Freq: Once | INTRAVENOUS | Status: AC
  Administered 2019-12-06: 25 mL via INTRAVENOUS
  Filled 2019-12-06: qty 50

## 2019-12-06 MED ORDER — GLYCOPYRROLATE 0.2 MG/ML IJ SOLN: INTRAMUSCULAR | Status: DC | PRN

## 2019-12-06 MED ORDER — CEFAZOLIN SODIUM-DEXTROSE 2-4 GM/100ML-% IV SOLN
INTRAVENOUS | Status: AC
  Filled 2019-12-06: qty 100

## 2019-12-06 MED ORDER — PROPOFOL 10 MG/ML IV BOLUS
INTRAVENOUS | Status: AC
  Filled 2019-12-06: qty 40

## 2019-12-06 MED ORDER — SUCCINYLCHOLINE CHLORIDE 200 MG/10ML IV SOSY
PREFILLED_SYRINGE | INTRAVENOUS | Status: DC | PRN
  Administered 2019-12-06: 160 mg via INTRAVENOUS
  Administered 2019-12-06: 40 mg via INTRAVENOUS

## 2019-12-06 MED ORDER — ONDANSETRON HCL 4 MG/2ML IJ SOLN
INTRAMUSCULAR | Status: DC | PRN
  Administered 2019-12-06: 4 mg via INTRAVENOUS

## 2019-12-06 MED ORDER — LIDOCAINE-EPINEPHRINE 0.5 %-1:200000 IJ SOLN
INTRAMUSCULAR | Status: AC
  Filled 2019-12-06: qty 1

## 2019-12-06 MED ORDER — LIDOCAINE-EPINEPHRINE 0.5 %-1:200000 IJ SOLN
INTRAMUSCULAR | Status: DC | PRN
  Administered 2019-12-06: 4 mL

## 2019-12-06 MED ORDER — DEXTROSE 50 % IV SOLN
INTRAVENOUS | Status: AC
  Administered 2019-12-06: 12.5 mL via INTRAVENOUS
  Filled 2019-12-06: qty 50

## 2019-12-06 MED ORDER — LIDOCAINE 2% (20 MG/ML) 5 ML SYRINGE
INTRAMUSCULAR | Status: DC | PRN
  Administered 2019-12-06: 100 mg via INTRAVENOUS

## 2019-12-06 SURGICAL SUPPLY — 36 items
BLADE SURG 15 STRL LF DISP TIS (BLADE) ×1 IMPLANT
BLADE SURG 15 STRL SS (BLADE) ×1
BNDG ELASTIC 3X5.8 VLCR STR LF (GAUZE/BANDAGES/DRESSINGS) ×2 IMPLANT
BNDG ELASTIC 4X5.8 VLCR STR LF (GAUZE/BANDAGES/DRESSINGS) ×1 IMPLANT
BNDG GAUZE ELAST 4 BULKY (GAUZE/BANDAGES/DRESSINGS) ×2 IMPLANT
CABLE BIPOLOR RESECTION CORD (MISCELLANEOUS) ×2 IMPLANT
COVER WAND RF STERILE (DRAPES) ×2 IMPLANT
DECANTER SPIKE VIAL GLASS SM (MISCELLANEOUS) ×2 IMPLANT
DRAPE EXTREMITY T 121X128X90 (DISPOSABLE) ×2 IMPLANT
DRAPE HALF SHEET 40X57 (DRAPES) ×2 IMPLANT
DRSG TELFA 3X8 NADH (GAUZE/BANDAGES/DRESSINGS) ×2 IMPLANT
DURAPREP 26ML APPLICATOR (WOUND CARE) ×2 IMPLANT
GAUZE 4X4 16PLY RFD (DISPOSABLE) ×2 IMPLANT
GLOVE ECLIPSE 6.5 STRL STRAW (GLOVE) ×2 IMPLANT
GOWN STRL REUS W/ TWL LRG LVL3 (GOWN DISPOSABLE) ×2 IMPLANT
GOWN STRL REUS W/ TWL XL LVL3 (GOWN DISPOSABLE) IMPLANT
GOWN STRL REUS W/TWL 2XL LVL3 (GOWN DISPOSABLE) IMPLANT
GOWN STRL REUS W/TWL LRG LVL3 (GOWN DISPOSABLE) ×2
GOWN STRL REUS W/TWL XL LVL3 (GOWN DISPOSABLE)
KIT BASIN OR (CUSTOM PROCEDURE TRAY) ×2 IMPLANT
KIT TURNOVER KIT B (KITS) ×2 IMPLANT
NDL HYPO 25X1 1.5 SAFETY (NEEDLE) ×1 IMPLANT
NEEDLE HYPO 25X1 1.5 SAFETY (NEEDLE) ×2 IMPLANT
NS IRRIG 1000ML POUR BTL (IV SOLUTION) ×2 IMPLANT
PACK SURGICAL SETUP 50X90 (CUSTOM PROCEDURE TRAY) ×2 IMPLANT
PAD ARMBOARD 7.5X6 YLW CONV (MISCELLANEOUS) ×6 IMPLANT
PAD DRESSING TELFA 3X8 NADH (GAUZE/BANDAGES/DRESSINGS) IMPLANT
STOCKINETTE 4X48 STRL (DRAPES) ×2 IMPLANT
SUT ETHILON 3 0 PS 1 (SUTURE) ×2 IMPLANT
SYR BULB 3OZ (MISCELLANEOUS) ×2 IMPLANT
SYR CONTROL 10ML LL (SYRINGE) ×2 IMPLANT
TOWEL GREEN STERILE (TOWEL DISPOSABLE) ×2 IMPLANT
TOWEL GREEN STERILE FF (TOWEL DISPOSABLE) ×2 IMPLANT
TUBE CONNECTING 12X1/4 (SUCTIONS) ×2 IMPLANT
UNDERPAD 30X30 (UNDERPADS AND DIAPERS) ×2 IMPLANT
WATER STERILE IRR 1000ML POUR (IV SOLUTION) ×2 IMPLANT

## 2019-12-06 NOTE — Op Note (Signed)
   7:37 PM  PATIENT:  Jaime Riggs  38 y.o. female  PRE-OPERATIVE DIAGNOSIS:  left Carpal Tunnel Syndrome  POST-OPERATIVE DIAGNOSIS:  left Carpal Tunnel Syndrome  PROCEDURE:  Procedure(s): CARPAL TUNNEL RELEASE-LEFT  SURGEON: Surgeon(s): Ashok Pall, MD  ANESTHESIA:   local and general  EBL:  No intake/output data recorded.  COUNT:correct.  DICTATION: Jaime Riggs was taken to the operating room, given IV sedation, and positioned on the operating room table. female had their left upper extremity prepped and draped in a sterile manner. I infiltrated Licodcaine  [id number] strength epinephrine into the planned incision starting at the proximal palmar crease extending into the hand ~ 1.5cm. I opened the skin with a 15 blade and extended the incision through the skin into the subcutaneous tissue. I used the bipolar cautery to control the subcutaneous bleeding. I dissected sharply through the tissue using forceps also to expose the transverse carpal ligament. I divided the transverse carpal ligament sharply with the 15 blade using the forceps to protect the contents of the carpal tunnel. With the scissors I divided the ligament both proximally and distally to decompress the entire carpal tunnel. I used the scissors to dissect into the forearm to create space to divide the ligament to the proximal palmar crease, and distally into the palm.  I irrigated the wound then closed the incision with vertical interrupted vertical mattress sutures. I placed a sterile dressing, then wrapped the proximal hand and distal forearm with an ace wrap.  PLAN OF CARE: Discharge to home after PACU  PATIENT DISPOSITION:  PACU - hemodynamically stable.   Delay start of Pharmacological VTE agent (>24hrs) due to surgical blood loss or risk of bleeding:  yes

## 2019-12-06 NOTE — Transfer of Care (Signed)
Immediate Anesthesia Transfer of Care Note  Patient: Jaime Riggs  Procedure(s) Performed: CARPAL TUNNEL RELEASE-LEFT (Left )  Patient Location: PACU  Anesthesia Type:General  Level of Consciousness: awake, alert , oriented and patient cooperative  Airway & Oxygen Therapy: Patient Spontanous Breathing and Patient connected to face mask oxygen  Post-op Assessment: Report given to RN, Post -op Vital signs reviewed and stable and Patient moving all extremities X 4  Post vital signs: Reviewed and stable  Last Vitals:  Vitals Value Taken Time  BP    Temp    Pulse    Resp    SpO2      Last Pain:  Vitals:   12/06/19 1423  PainSc: 0-No pain         Complications: No apparent anesthesia complications

## 2019-12-06 NOTE — Discharge Instructions (Addendum)

## 2019-12-06 NOTE — Anesthesia Postprocedure Evaluation (Signed)
Anesthesia Post Note  Patient: Jaime Riggs  Procedure(s) Performed: CARPAL TUNNEL RELEASE-LEFT (Left )     Patient location during evaluation: PACU Anesthesia Type: General Level of consciousness: awake and alert, patient cooperative and oriented Pain management: pain level controlled Vital Signs Assessment: post-procedure vital signs reviewed and stable Respiratory status: spontaneous breathing, nonlabored ventilation and respiratory function stable Cardiovascular status: blood pressure returned to baseline and stable Postop Assessment: no apparent nausea or vomiting Anesthetic complications: no    Last Vitals:  Vitals:   12/06/19 1936 12/06/19 1945  BP: 132/79   Pulse: 96   Resp: (!) 24   Temp:  36.8 C  SpO2: 98%     Last Pain:  Vitals:   12/06/19 1905  PainSc: Asleep                 Kieryn Burtis,E. Meena Barrantes

## 2019-12-06 NOTE — Anesthesia Procedure Notes (Addendum)
Procedure Name: Intubation Date/Time: 12/06/2019 6:20 PM Performed by: Griffin Dakin, CRNA Pre-anesthesia Checklist: Patient identified, Emergency Drugs available, Suction available and Patient being monitored Patient Re-evaluated:Patient Re-evaluated prior to induction Oxygen Delivery Method: Circle system utilized Preoxygenation: Pre-oxygenation with 100% oxygen Induction Type: Rapid sequence Ventilation: Unable to mask ventilate and Oral airway inserted - appropriate to patient size LMA: LMA inserted LMA Size: 4.0 Laryngoscope Size: Glidescope and 3 Tube type: Oral Tube size: 7.0 mm Number of attempts: 3 Airway Equipment and Method: Oral airway,  Video-laryngoscopy and Rigid stylet Placement Confirmation: ETT inserted through vocal cords under direct vision,  positive ETCO2 and breath sounds checked- equal and bilateral Secured at: 22 cm Tube secured with: Tape Dental Injury: Teeth and Oropharynx as per pre-operative assessment  Difficulty Due To: Difficulty was anticipated, Difficult Airway- due to limited oral opening and Difficult Airway- due to large tongue Future Recommendations: Recommend- induction with short-acting agent, and alternative techniques readily available Comments: Glidescope 3 used unsuccesfully by CRNA, second attempt unsuccessful by MD due to limited mouth opening and limited view. Unable to ventilate due to body habitus. LMA 4 inserted with success. Able to ventilate to 97%. LMA removed and 3rd attempt successful.

## 2019-12-06 NOTE — H&P (Signed)
BP (!) 149/79   Pulse 90   Temp 98.2 F (36.8 C)   Resp 20   Ht 5\' 8"  (1.727 m)   Wt (!) 156.5 kg   SpO2 100%   BMI 52.46 kg/m   She cannot feel the right hand or the left hand.  She is 38 years of age, right-handed.     SOCIAL HISTORY :  She does not have a history of substance abuse.  She does drink alcohol socially.  No information given about smoking.     PAST SURGICAL HISTORY :  She has had 2 cesarean sections and had a lipoma removed from her neck in March of 2011.  She says it was the size of a grapefruit.     MEDICATIONS :  She takes Cyclobenzaprine, Sertraline, Cetirizine, Ibuprofen, and birth control pill.     FAMILY HISTORY :  Mother and father are both deceased secondary to HIV.  She has weakness in her hands and numbness and tingling in all the fingers.  She has pain in the arm and back.  She has neck pain also and shoulder pain bilaterally.     REVIEW OF SYSTEMS :  Positive for arthritis, neck pain, arm pain, joint pain, anxiety, and posttraumatic stress disorder.     VITALS :  Height 5 feet 8 inches, weight 351.2 pounds.  Temperature is 97.2, blood pressure is 145/85, pulse is 81, pain is 6/10.     PHYSICAL EXAMINATION :  She has full strength in the upper and lower extremities.  Positive Tinel sign over the transverse carpal ligaments bilaterally, none over the cubital tunnel.  2+ reflexes, biceps, triceps, brachioradialis, knees, and ankles.  Gait is normal.     ASSESSMENT AND PLAN :  I explained carpal tunnel release to her using visual aids, showing her on her hand where the incision would be.  She would like to do that as soon as possible.

## 2019-12-06 NOTE — Anesthesia Preprocedure Evaluation (Addendum)
Anesthesia Evaluation  Patient identified by MRN, date of birth, ID band Patient awake    Reviewed: Allergy & Precautions, H&P , NPO status , Patient's Chart, lab work & pertinent test results  History of Anesthesia Complications (+) PONV  Airway Mallampati: III  TM Distance: >3 FB Neck ROM: Full  Mouth opening: Limited Mouth Opening  Dental no notable dental hx. (+) Teeth Intact, Dental Advisory Given   Pulmonary asthma , sleep apnea and Continuous Positive Airway Pressure Ventilation ,    Pulmonary exam normal breath sounds clear to auscultation       Cardiovascular negative cardio ROS   Rhythm:Regular Rate:Normal     Neuro/Psych  Headaches, Seizures -, Well Controlled,  Anxiety negative neurological ROS  negative psych ROS   GI/Hepatic negative GI ROS, Neg liver ROS, GERD  ,  Endo/Other  negative endocrine ROSdiabetes, Type 2, Oral Hypoglycemic AgentsMorbid obesity  Renal/GU negative Renal ROS  negative genitourinary   Musculoskeletal   Abdominal   Peds  Hematology negative hematology ROS (+)   Anesthesia Other Findings   Reproductive/Obstetrics negative OB ROS                            Anesthesia Physical Anesthesia Plan  ASA: III  Anesthesia Plan: General   Post-op Pain Management:    Induction: Intravenous  PONV Risk Score and Plan: 4 or greater and Ondansetron, Dexamethasone, Midazolam and Scopolamine patch - Pre-op  Airway Management Planned: Oral ETT and Video Laryngoscope Planned  Additional Equipment:   Intra-op Plan:   Post-operative Plan: Extubation in OR  Informed Consent: I have reviewed the patients History and Physical, chart, labs and discussed the procedure including the risks, benefits and alternatives for the proposed anesthesia with the patient or authorized representative who has indicated his/her understanding and acceptance.     Dental advisory  given  Plan Discussed with: CRNA  Anesthesia Plan Comments:       Anesthesia Quick Evaluation

## 2019-12-07 DIAGNOSIS — T884XXA Failed or difficult intubation, initial encounter: Secondary | ICD-10-CM | POA: Diagnosis present

## 2019-12-07 NOTE — Addendum Note (Signed)
Addendum  created 12/07/19 0729 by Josephine Igo, CRNA   Problem List modified

## 2019-12-11 ENCOUNTER — Other Ambulatory Visit: Payer: Self-pay | Admitting: Neurosurgery

## 2019-12-11 DIAGNOSIS — M542 Cervicalgia: Secondary | ICD-10-CM

## 2019-12-21 HISTORY — PX: LAPAROSCOPIC GASTRIC BAND REMOVAL WITH LAPAROSCOPIC GASTRIC SLEEVE RESECTION: SHX6498

## 2019-12-25 ENCOUNTER — Ambulatory Visit: Payer: Medicaid Other | Admitting: Physical Therapy

## 2019-12-27 ENCOUNTER — Encounter: Payer: Self-pay | Admitting: Physician Assistant

## 2019-12-27 ENCOUNTER — Other Ambulatory Visit: Payer: Self-pay

## 2019-12-27 ENCOUNTER — Ambulatory Visit (INDEPENDENT_AMBULATORY_CARE_PROVIDER_SITE_OTHER): Payer: Medicaid Other | Admitting: Physician Assistant

## 2019-12-27 DIAGNOSIS — G8929 Other chronic pain: Secondary | ICD-10-CM | POA: Diagnosis not present

## 2019-12-27 DIAGNOSIS — M25562 Pain in left knee: Secondary | ICD-10-CM | POA: Diagnosis not present

## 2019-12-27 MED ORDER — METHYLPREDNISOLONE ACETATE 40 MG/ML IJ SUSP
40.0000 mg | INTRAMUSCULAR | Status: AC | PRN
  Administered 2019-12-27: 40 mg via INTRA_ARTICULAR

## 2019-12-27 MED ORDER — LIDOCAINE HCL 1 % IJ SOLN
3.0000 mL | INTRAMUSCULAR | Status: AC | PRN
  Administered 2019-12-27: 14:00:00 3 mL

## 2019-12-27 NOTE — Progress Notes (Signed)
   Procedure Note  Patient: Jaime Riggs             Date of Birth: July 24, 1981           MRN: [id number]             Visit Date: 12/27/2019   HPI: Ms. Islas returns today for left knee injection.  She continues to work on strengthening the knee.  And is working on weight loss. She has medial joint line narrowing left knee and mild patellofemoral changes on radiographs.  She has had no new injury to either knee.  States the right knee injection gave her good relief from the pain she is having in the knee.  Physical exam: Left knee good range of motion no abnormal warmth erythema. Procedures: Visit Diagnoses:  1. Chronic pain of left knee     Large Joint Inj: L knee on 12/27/2019 1:40 PM Indications: pain Details: 22 G 1.5 in needle, anterolateral approach  Arthrogram: No  Medications: 3 mL lidocaine 1 %; 40 mg methylPREDNISolone acetate 40 MG/ML Outcome: tolerated well, no immediate complications Procedure, treatment alternatives, risks and benefits explained, specific risks discussed. Consent was given by the patient. Immediately prior to procedure a time out was called to verify the correct patient, procedure, equipment, support staff and site/side marked as required. Patient was prepped and draped in the usual sterile fashion.    Plan: She will work on range of motion and strengthening of both knees.  She understands to wait at least 3 months between injections.  Questions encouraged and answered at length

## 2020-01-02 ENCOUNTER — Ambulatory Visit
Admission: RE | Admit: 2020-01-02 | Discharge: 2020-01-02 | Disposition: A | Discharge status: Home / Self Care | Payer: Medicaid Other | Source: Ambulatory Visit | Attending: Neurosurgery | Admitting: Neurosurgery

## 2020-01-02 ENCOUNTER — Other Ambulatory Visit: Payer: Self-pay

## 2020-01-02 DIAGNOSIS — M542 Cervicalgia: Secondary | ICD-10-CM

## 2020-01-07 ENCOUNTER — Encounter: Payer: Self-pay | Admitting: Physical Therapy

## 2020-01-07 ENCOUNTER — Other Ambulatory Visit: Payer: Self-pay

## 2020-01-07 ENCOUNTER — Ambulatory Visit: Payer: Medicaid Other | Attending: Physician Assistant | Admitting: Physical Therapy

## 2020-01-07 DIAGNOSIS — G8929 Other chronic pain: Secondary | ICD-10-CM | POA: Diagnosis present

## 2020-01-07 DIAGNOSIS — R2689 Other abnormalities of gait and mobility: Secondary | ICD-10-CM | POA: Insufficient documentation

## 2020-01-07 DIAGNOSIS — M25662 Stiffness of left knee, not elsewhere classified: Secondary | ICD-10-CM | POA: Diagnosis present

## 2020-01-07 DIAGNOSIS — M25562 Pain in left knee: Secondary | ICD-10-CM | POA: Insufficient documentation

## 2020-01-07 DIAGNOSIS — M25561 Pain in right knee: Secondary | ICD-10-CM | POA: Diagnosis present

## 2020-01-08 ENCOUNTER — Encounter: Payer: Self-pay | Admitting: Physical Therapy

## 2020-01-08 NOTE — Therapy (Signed)
[place], [place], [address] Phone: [phone]   Fax:  [phone]  Physical Therapy Treatment/Re-eval  Patient Details  Name: MARSHAWN ARNER MRN: [id number] Date of Birth: 07-23-1981 Referring Provider (PT): Erskine Emery PA    Encounter Date: 01/07/2020  PT End of Session - 01/08/20 1716    Visit Number  5    Number of Visits  8    Date for PT Re-Evaluation  02/05/20    Authorization Type  [employer]    PT Start Time  [id number]    PT Stop Time  1718    PT Time Calculation (min)  41 min       Past Medical History:  Diagnosis Date  . Asthma   . Diabetes mellitus without complication ([place])   . Difficult intubation 12/06/2019   TMJ; small mouth; limited neck extension; redundant soft tissue  . Epilepsy ([place])    last episode  two yrs.  Marland Kitchen GERD (gastroesophageal reflux disease)   . Migraines   . Neck mass   . Obesity (BMI 30-39.9) 03/27/2012  . PONV (postoperative nausea and vomiting)   . PTSD (post-traumatic stress disorder) 1992   from childhood experiences  . Shortness of breath    with preg with multiple only  . Sleep apnea     Past Surgical History:  Procedure Laterality Date  . CARPAL TUNNEL RELEASE Left 12/06/2019   Procedure: CARPAL TUNNEL RELEASE-LEFT;  Surgeon: Ashok Pall, MD;  Location: [facility];  Service: Neurosurgery;  Laterality: Left;  CARPAL TUNNEL RELEASE-LEFT  . CESAREAN SECTION    . CESAREAN SECTION  12/04/2012   Procedure: CESAREAN SECTION;  Surgeon: Logan Bores, MD;  Location: [facility];  Service: Obstetrics;  Laterality: N/A;  repeat c/s-TWINS  . MOUTH SURGERY    . NECK MASS EXCISION  04/04/2012   pathology c/w lipoma    There were no vitals filed for this visit.  Subjective Assessment - 01/07/20 1641    Subjective  Patient has a long history of neck pain. The pain has been increasing over the past 2-3 months to the point where it can apporach a 10/10. `    How long  can you sit comfortably?  aching while sitting    How long can you walk comfortably?  agitation with standing and walking    Diagnostic tests  LeftX-ray: milt to moderate degeneration Right x-ray: mild degeneration    Currently in Pain?  Yes    Pain Score  8     Pain Location  Neck    Pain Orientation  Left    Pain Descriptors / Indicators  Aching    Pain Type  Chronic pain    Pain Onset  More than a month ago    Pain Frequency  Constant    Aggravating Factors   turning her head    Pain Relieving Factors  rest    Multiple Pain Sites  No         [facility] PT Assessment - 01/08/20 0001      Assessment   Medical Diagnosis  Cervicalgia     Referring Provider (PT)  Erskine Emery PA     Hand Dominance  Right    Prior Therapy  Is currently in PT for her knee       Precautions   Precautions  None      Restrictions   Weight Bearing Restrictions  No      Balance Screen  Has the patient fallen in the past 6 months  No    How many times?  n    Has the patient had a decrease in activity level because of a fear of falling?   No      Prior Function   Level of Independence  Independent    Vocation  Unemployed    Leisure  run, plaly basketball       Cognition   Overall Cognitive Status  Within Functional Limits for tasks assessed    Attention  Focused    Focused Attention  Appears intact    Memory  Appears intact    Awareness  Appears intact    Problem Solving  Appears intact      Sensation   Light Touch  Appears Intact    Additional Comments  pain can radiate into left shoulder       Coordination   Gross Motor Movements are Fluid and Coordinated  Yes    Fine Motor Movements are Fluid and Coordinated  Yes      AROM   Overall AROM Comments  active shoulder flexion to 90 degrees bilateral with significnat pain. Pain with active ER bilateral. Patient will not reach behind her back.     Cervical Flexion  5    Cervical Extension  5    Cervical - Right Rotation  45    Cervical -  Left Rotation  45      Strength   Strength Assessment Site  Shoulder    Right/Left Shoulder  Right;Left    Right Shoulder Flexion  4/5    Right Shoulder External Rotation  4/5    Right Shoulder Horizontal ABduction  4/5    Left Shoulder Horizontal ABduction  4/5    Left Shoulder Horizontal ADduction  4-/5                   [facility] Adult PT Treatment/Exercise - 01/08/20 0001      Manual Therapy   Manual Therapy  Soft tissue mobilization;Manual Traction    Soft tissue mobilization  to cervical paraspianls and upper traps     Manual Traction  gentle on wedge to cervical spine       Neck Exercises: Stretches   Other Neck Stretches  supine rotation in pain free range x10              PT Education - 01/08/20 1345    Education Details  imporotance of increasing pain free movement    Person(s) Educated  Patient    Methods  Explanation;Demonstration;Tactile cues;Verbal cues    Comprehension  Verbalized understanding;Returned demonstration;Verbal cues required;Tactile cues required       PT Short Term Goals - 01/08/20 1715      PT SHORT TERM GOAL #1   Title  Patient will be inpdepdnet with a basic HEP    Baseline  perfroming at home for knee. Updated HEP given for Cervicalgia    Time  4    Period  Weeks    Status  Achieved    Target Date  01/29/20      PT SHORT TERM GOAL #2   Title  Patient will demonstrate full left extension without pain    Baseline  full extension without pain in her knee    Time  4    Period  Weeks    Status  Achieved    Target Date  01/29/20      PT SHORT TERM GOAL #3  Title  Patient will increase gross bilateral lower extremity strength to 4/5    Baseline  Not tested today 2nd to Cervical re-eval    Time  4    Period  Weeks    Status  On-going    Target Date  01/29/20      PT SHORT TERM GOAL #4   Title  Patient will increase bilateral flexion and extension by 10 degrees    Baseline  5 degrees each with significant pain    Time   3    Period  Weeks    Status  New    Target Date  01/29/20        PT Long Term Goals - 01/08/20 1719      PT LONG TERM GOAL #1   Title  Patient will stand for 1 hour without increased bilateral knee pain    Baseline  20-30 minutes before increased pain    Time  3    Period  Weeks    Status  New    Target Date  02/19/20      PT LONG TERM GOAL #2   Title  Patient will increase bilateral cervical rotation to 65 degrees in order to drive safely    Baseline  45 degrees with pain bilateral    Time  6    Period  Weeks    Status  New    Target Date  02/19/20            Plan - 01/08/20 1352    Clinical Impression Statement  Therapy assesed patients cervical spine today. She has limited cervical mobility especially in flexion and extension. The patient reports she tries hard not to move her neck because it will hurt. She has significant spasming in her upper traps and cervical paraspinals. She has pain that can radiate into her left shoulder. She alos has pain in her left jaw at times. She would benefit from skilled therapy to decrease spasming and increase cervical motion. therapy will also continue to progress knees as tolerated.    Personal Factors and Comorbidities  Comorbidity 1;Comorbidity 2;Time since onset of injury/illness/exacerbation    Comorbidities  obesity, OA, jaw pain, headaches    Examination-Activity Limitations  Squat;Stairs;Sit;Transfers;Stand;Reach Overhead    Stability/Clinical Decision Making  Evolving/Moderate complexity    Clinical Decision Making  Moderate    Rehab Potential  Excellent    PT Frequency  1x / week    PT Duration  4 weeks    PT Treatment/Interventions  ADLs/Self Care Home Management;Cryotherapy;Electrical Stimulation;Iontophoresis 4mg /ml Dexamethasone;Moist Heat;Ultrasound;DME Instruction;Gait training;Stair training;Functional mobility training;Therapeutic activities;Therapeutic exercise;Neuromuscular re-education;Patient/family  education;Manual techniques;Passive range of motion    PT Next Visit Plan  tape botlh knees, review how to tape. Continue to encourage movement; gait training with a cane; knee extension stretching on the left; Patient is motivated but needs to be encouraged to move. progress standing exercises as tolerated ; continue with taping; begin manual therapy to cervical spine and upper trap; re-asses stretches; begin postural correction; consider TPDN    PT Home Exercise Plan  qaud sets; patella self mobilization, supine clam shell green band and SLR    Consulted and Agree with Plan of Care  Patient       Patient will benefit from skilled therapeutic intervention in order to improve the following deficits and impairments:  Decreased strength, Decreased activity tolerance, Decreased endurance, Decreased range of motion, Impaired UE functional use, Pain, Obesity, Improper body mechanics, Difficulty walking, Impaired perceived functional  ability, Decreased balance  Visit Diagnosis: Chronic pain of left knee  Chronic pain of right knee  Other abnormalities of gait and mobility  Stiffness of left knee, not elsewhere classified     Problem List Patient Active Problem List   Diagnosis Date Noted  . Difficult intubation   . Lipoma of posterior neck/upper back 12x9x6cm 03/27/2012  . Obesity, Class III, BMI 40-49.9 (morbid obesity) ([place]) 03/27/2012    Carney Living 01/08/2020, 5:29 PM  [facility] [address] [place], [place], [address] Phone: [phone]   Fax:  [phone]  Name: ELENAMARIE FASCHING MRN: [id number] Date of Birth: 1981/08/23

## 2020-01-17 ENCOUNTER — Ambulatory Visit: Payer: Medicaid Other | Admitting: Physical Therapy

## 2020-01-22 ENCOUNTER — Ambulatory Visit: Payer: Medicaid Other | Attending: Physician Assistant | Admitting: Physical Therapy

## 2020-01-22 DIAGNOSIS — M25562 Pain in left knee: Secondary | ICD-10-CM | POA: Insufficient documentation

## 2020-01-22 DIAGNOSIS — M25662 Stiffness of left knee, not elsewhere classified: Secondary | ICD-10-CM | POA: Insufficient documentation

## 2020-01-22 DIAGNOSIS — M25561 Pain in right knee: Secondary | ICD-10-CM | POA: Insufficient documentation

## 2020-01-22 DIAGNOSIS — G8929 Other chronic pain: Secondary | ICD-10-CM | POA: Insufficient documentation

## 2020-01-22 DIAGNOSIS — R2689 Other abnormalities of gait and mobility: Secondary | ICD-10-CM | POA: Insufficient documentation

## 2020-01-24 ENCOUNTER — Encounter: Payer: Self-pay | Admitting: Physical Therapy

## 2020-01-24 ENCOUNTER — Other Ambulatory Visit: Payer: Self-pay

## 2020-01-24 ENCOUNTER — Ambulatory Visit: Payer: Medicaid Other | Admitting: Physical Therapy

## 2020-01-24 DIAGNOSIS — M25561 Pain in right knee: Secondary | ICD-10-CM

## 2020-01-24 DIAGNOSIS — M25562 Pain in left knee: Secondary | ICD-10-CM

## 2020-01-24 DIAGNOSIS — M25662 Stiffness of left knee, not elsewhere classified: Secondary | ICD-10-CM

## 2020-01-24 DIAGNOSIS — G8929 Other chronic pain: Secondary | ICD-10-CM | POA: Diagnosis present

## 2020-01-24 DIAGNOSIS — R2689 Other abnormalities of gait and mobility: Secondary | ICD-10-CM | POA: Diagnosis present

## 2020-01-24 NOTE — Therapy (Signed)
[place], [place], [address] Phone: [phone]   Fax:  [phone]  Physical Therapy Treatment  Patient Details  Name: Jaime Riggs MRN: [id number] Date of Birth: [birth year] Referring Provider (PT): Erskine Emery PA    Encounter Date: 01/24/2020  PT End of Session - 01/24/20 1338    Visit Number  6    Number of Visits  8    Date for PT Re-Evaluation  02/05/20    Authorization Type  Mediciad    PT Start Time  1103    PT Stop Time  1145    PT Time Calculation (min)  42 min    Activity Tolerance  Patient tolerated treatment well    Behavior During Therapy  [facility] for tasks assessed/performed       Past Medical History:  Diagnosis Date  . Asthma   . Diabetes mellitus without complication ([place])   . Difficult intubation 12/06/2019   TMJ; small mouth; limited neck extension; redundant soft tissue  . Epilepsy ([place])    last episode  two yrs.  Marland Kitchen GERD (gastroesophageal reflux disease)   . Migraines   . Neck mass   . Obesity (BMI 30-39.9) 03/27/2012  . PONV (postoperative nausea and vomiting)   . PTSD (post-traumatic stress disorder) 1992   from childhood experiences  . Shortness of breath    with preg with multiple only  . Sleep apnea     Past Surgical History:  Procedure Laterality Date  . CARPAL TUNNEL RELEASE Left 12/06/2019   Procedure: CARPAL TUNNEL RELEASE-LEFT;  Surgeon: Ashok Pall, MD;  Location: [facility];  Service: Neurosurgery;  Laterality: Left;  CARPAL TUNNEL RELEASE-LEFT  . CESAREAN SECTION    . CESAREAN SECTION  12/04/2012   Procedure: CESAREAN SECTION;  Surgeon: Logan Bores, MD;  Location: [facility];  Service: Obstetrics;  Laterality: N/A;  repeat c/s-TWINS  . MOUTH SURGERY    . NECK MASS EXCISION  04/04/2012   pathology c/w lipoma    There were no vitals filed for this visit.  Subjective Assessment - 01/24/20 1329    Subjective  Patient reports her neck is still stiff. She  has tried to move it more at home. She had one isntance of pain over the last week in her leg but it is better now.    How long can you sit comfortably?  aching while sitting    How long can you walk comfortably?  agitation with standing and walking    Diagnostic tests  LeftX-ray: milt to moderate degeneration Right x-ray: mild degeneration    Currently in Pain?  Yes    Pain Score  7     Pain Location  Neck    Pain Orientation  Left    Pain Descriptors / Indicators  Aching    Pain Type  Chronic pain    Pain Onset  More than a month ago    Pain Frequency  Constant    Aggravating Factors   turning her head    Pain Relieving Factors  rest    Effect of Pain on Daily Activities  reviewed HEP and symptom management                       [facility] Adult PT Treatment/Exercise - 01/24/20 0001      Neck Exercises: Standing   Other Standing Exercises  shown how to put band in door at home scpa retraction and extnesion 2x10 yellow  mod cuing for       Manual Therapy   Manual Therapy  Soft tissue mobilization;Manual Traction    Soft tissue mobilization  to cervical paraspianls and upper traps using IASTYM     Manual Traction  gentle on wedge to cervical spine       Neck Exercises: Stretches   Upper Trapezius Stretch  --   patient attmepted but was unable to complete because of pain   Levator Stretch  2 reps;20 seconds    Other Neck Stretches  reviewed movements into pain free ranges              PT Education - 01/24/20 1333    Education Details  importance of posture and strengthening    Person(s) Educated  Patient    Methods  Explanation;Demonstration;Tactile cues;Verbal cues    Comprehension  Verbalized understanding;Returned demonstration;Verbal cues required;Tactile cues required       PT Short Term Goals - 01/24/20 1633      PT SHORT TERM GOAL #1   Title  Patient will be inpdepdnet with a basic HEP    Baseline  perfroming at home for knee. Updated HEP given for  Cervicalgia    Time  4    Period  Weeks    Status  Achieved    Target Date  01/29/20      PT SHORT TERM GOAL #2   Title  Patient will demonstrate full left extension without pain    Baseline  full extension without pain in her knee    Time  4    Period  Weeks    Status  Achieved    Target Date  01/29/20      PT SHORT TERM GOAL #3   Title  Patient will increase gross bilateral lower extremity strength to 4/5    Baseline  Not tested today 2nd to Cervical re-eval    Time  4    Period  Weeks    Status  On-going    Target Date  01/29/20      PT SHORT TERM GOAL #4   Title  Patient will increase bilateral flexion and extension by 10 degrees    Baseline  5 degrees each with significant pain    Time  3    Period  Weeks    Target Date  01/29/20        PT Long Term Goals - 01/08/20 1719      PT LONG TERM GOAL #1   Title  Patient will stand for 1 hour without increased bilateral knee pain    Baseline  20-30 minutes before increased pain    Time  3    Period  Weeks    Status  New    Target Date  02/19/20      PT LONG TERM GOAL #2   Title  Patient will increase bilateral cervical rotation to 65 degrees in order to drive safely    Baseline  45 degrees with pain bilateral    Time  6    Period  Weeks    Status  New    Target Date  02/19/20            Plan - 01/24/20 1339    Clinical Impression Statement  Patient reported some pain with simple scap retraction but she was advised with light exercises she may feel slight discomfort. She had pain with an upper trap stretch so that was not given. She reported improved pain and  stiffness with manual therapy. Per visual inspection    Personal Factors and Comorbidities  Comorbidity 1;Comorbidity 2;Time since onset of injury/illness/exacerbation    Comorbidities  obesity, OA, jaw pain, headaches    Examination-Activity Limitations  Squat;Stairs;Sit;Transfers;Stand;Reach Overhead    Examination-Participation Restrictions   Cleaning;Meal Prep;Driving;Community Activity;Shop    Stability/Clinical Decision Making  Evolving/Moderate complexity    Clinical Decision Making  Moderate    Rehab Potential  Excellent    PT Frequency  1x / week    PT Duration  4 weeks    PT Treatment/Interventions  ADLs/Self Care Home Management;Cryotherapy;Electrical Stimulation;Iontophoresis 4mg /ml Dexamethasone;Moist Heat;Ultrasound;DME Instruction;Gait training;Stair training;Functional mobility training;Therapeutic activities;Therapeutic exercise;Neuromuscular re-education;Patient/family education;Manual techniques;Passive range of motion    PT Next Visit Plan  tape botlh knees, review how to tape. Continue to encourage movement; gait training with a cane; knee extension stretching on the left; Patient is motivated but needs to be encouraged to move. progress standing exercises as tolerated ; continue with taping; begin manual therapy to cervical spine and upper trap; re-asses stretches; begin postural correction; consider TPDN    PT Home Exercise Plan  qaud sets; patella self mobilization, supine clam shell green band and SLR    Consulted and Agree with Plan of Care  Patient       Patient will benefit from skilled therapeutic intervention in order to improve the following deficits and impairments:  Decreased strength, Decreased activity tolerance, Decreased endurance, Decreased range of motion, Impaired UE functional use, Pain, Obesity, Improper body mechanics, Difficulty walking, Impaired perceived functional ability, Decreased balance  Visit Diagnosis: Chronic pain of left knee  Chronic pain of right knee  Other abnormalities of gait and mobility  Stiffness of left knee, not elsewhere classified     Problem List Patient Active Problem List   Diagnosis Date Noted  . Difficult intubation   . Lipoma of posterior neck/upper back 12x9x6cm 03/27/2012  . Obesity, Class III, BMI 40-49.9 (morbid obesity) ([place]) 03/27/2012    Carney Living PT DPT 01/24/2020, 4:44 PM  [facility] [address] [place], [place], [address] Phone: [phone]   Fax:  [phone]  Name: CINIYAH TELLA MRN: [id number] Date of Birth: 03/07/1981

## 2020-01-28 ENCOUNTER — Other Ambulatory Visit: Payer: Self-pay

## 2020-01-28 ENCOUNTER — Ambulatory Visit: Payer: Medicaid Other | Admitting: Physical Therapy

## 2020-01-28 DIAGNOSIS — M25662 Stiffness of left knee, not elsewhere classified: Secondary | ICD-10-CM

## 2020-01-28 DIAGNOSIS — M25562 Pain in left knee: Secondary | ICD-10-CM | POA: Diagnosis not present

## 2020-01-28 DIAGNOSIS — G8929 Other chronic pain: Secondary | ICD-10-CM

## 2020-01-28 DIAGNOSIS — R2689 Other abnormalities of gait and mobility: Secondary | ICD-10-CM

## 2020-01-29 ENCOUNTER — Encounter: Payer: Self-pay | Admitting: Physical Therapy

## 2020-01-29 NOTE — Therapy (Addendum)
[place], [place], [address] Phone: [phone]   Fax:  [phone]  Physical Therapy Treatment/Discharge   Patient Details  Name: Jaime Riggs MRN: [id number] Date of Birth: Aug 22, 1981 Referring Provider (PT): Erskine Emery PA    Encounter Date: 01/28/2020  PT End of Session - 01/29/20 1235    Visit Number  7    Number of Visits  8    Date for PT Re-Evaluation  02/05/20    Authorization Type  [employer]    PT Start Time  1630    PT Stop Time  1713    PT Time Calculation (min)  43 min    Activity Tolerance  Patient tolerated treatment well    Behavior During Therapy  [facility] for tasks assessed/performed       Past Medical History:  Diagnosis Date  . Asthma   . Diabetes mellitus without complication ([place])   . Difficult intubation 12/06/2019   TMJ; small mouth; limited neck extension; redundant soft tissue  . Epilepsy ([place])    last episode  two yrs.  Marland Kitchen GERD (gastroesophageal reflux disease)   . Migraines   . Neck mass   . Obesity (BMI 30-39.9) 03/27/2012  . PONV (postoperative nausea and vomiting)   . PTSD (post-traumatic stress disorder) 1992   from childhood experiences  . Shortness of breath    with preg with multiple only  . Sleep apnea     Past Surgical History:  Procedure Laterality Date  . CARPAL TUNNEL RELEASE Left 12/06/2019   Procedure: CARPAL TUNNEL RELEASE-LEFT;  Surgeon: Ashok Pall, MD;  Location: [facility];  Service: Neurosurgery;  Laterality: Left;  CARPAL TUNNEL RELEASE-LEFT  . CESAREAN SECTION    . CESAREAN SECTION  12/04/2012   Procedure: CESAREAN SECTION;  Surgeon: Logan Bores, MD;  Location: [facility];  Service: Obstetrics;  Laterality: N/A;  repeat c/s-TWINS  . MOUTH SURGERY    . NECK MASS EXCISION  04/04/2012   pathology c/w lipoma    There were no vitals filed for this visit.  Subjective Assessment - 01/29/20 1234    Subjective  Patient reports continued pain in  her neck. She has been working on her stretches and exercises and feels they help.    How long can you sit comfortably?  aching while sitting    How long can you walk comfortably?  agitation with standing and walking    Diagnostic tests  LeftX-ray: milt to moderate degeneration Right x-ray: mild degeneration    Currently in Pain?  Yes    Pain Score  6     Pain Location  Neck    Pain Orientation  Left    Pain Descriptors / Indicators  Aching    Pain Type  Chronic pain    Pain Onset  More than a month ago    Pain Frequency  Constant    Aggravating Factors   turning her head    Pain Relieving Factors  rest    Effect of Pain on Daily Activities  reviewed HEP and symptom management                       [facility] Adult PT Treatment/Exercise - 01/29/20 0001      Neck Exercises: Standing   Other Standing Exercises  scap retraction 2x10 yellow     Other Standing Exercises  shoulder extension 2x10 yellow       Neck Exercises: Seated   Other  Seated Exercise  seated bilateral ER x20 yellow    Other Seated Exercise  seated horzontal abdcution 2x10 yellow       Neck Exercises: Stretches   Levator Stretch  2 reps;20 seconds    Other Neck Stretches  reviewed movements into pain free ranges              PT Education - 01/29/20 1235    Education Details  reviewed stretches and ther-ex    Methods  Explanation;Demonstration;Tactile cues;Verbal cues    Comprehension  Verbalized understanding;Verbal cues required;Tactile cues required;Returned demonstration       PT Short Term Goals - 01/24/20 1633      PT SHORT TERM GOAL #1   Title  Patient will be inpdepdnet with a basic HEP    Baseline  perfroming at home for knee. Updated HEP given for Cervicalgia    Time  4    Period  Weeks    Status  Achieved    Target Date  01/29/20      PT SHORT TERM GOAL #2   Title  Patient will demonstrate full left extension without pain    Baseline  full extension without pain in her knee     Time  4    Period  Weeks    Status  Achieved    Target Date  01/29/20      PT SHORT TERM GOAL #3   Title  Patient will increase gross bilateral lower extremity strength to 4/5    Baseline  Not tested today 2nd to Cervical re-eval    Time  4    Period  Weeks    Status  On-going    Target Date  01/29/20      PT SHORT TERM GOAL #4   Title  Patient will increase bilateral flexion and extension by 10 degrees    Baseline  5 degrees each with significant pain    Time  3    Period  Weeks    Target Date  01/29/20        PT Long Term Goals - 01/08/20 1719      PT LONG TERM GOAL #1   Title  Patient will stand for 1 hour without increased bilateral knee pain    Baseline  20-30 minutes before increased pain    Time  3    Period  Weeks    Status  New    Target Date  02/19/20      PT LONG TERM GOAL #2   Title  Patient will increase bilateral cervical rotation to 65 degrees in order to drive safely    Baseline  45 degrees with pain bilateral    Time  6    Period  Weeks    Status  New    Target Date  02/19/20            Plan - 01/29/20 1235    Clinical Impression Statement  Therapy added scapular strnegthening exercises to her HEP. She had less difficulty this time perfroming them. She appears to have less spasming but had some spasming in her mid thoriacic area. She was advised to continue HEP.    Comorbidities  obesity, OA, jaw pain, headaches    Examination-Activity Limitations  Squat;Stairs;Sit;Transfers;Stand;Reach Overhead    Stability/Clinical Decision Making  Evolving/Moderate complexity    Clinical Decision Making  Moderate    Rehab Potential  Excellent    PT Frequency  1x / week    PT Duration  4 weeks    PT Treatment/Interventions  ADLs/Self Care Home Management;Cryotherapy;Electrical Stimulation;Iontophoresis 54m/ml Dexamethasone;Moist Heat;Ultrasound;DME Instruction;Gait training;Stair training;Functional mobility training;Therapeutic activities;Therapeutic  exercise;Neuromuscular re-education;Patient/family education;Manual techniques;Passive range of motion    PT Next Visit Plan  continue with HEP and symptom management    PT Home Exercise Plan  qaud sets; patella self mobilization, supine clam shell green band and SLR    Consulted and Agree with Plan of Care  Patient       Patient will benefit from skilled therapeutic intervention in order to improve the following deficits and impairments:  Decreased strength, Decreased activity tolerance, Decreased endurance, Decreased range of motion, Impaired UE functional use, Pain, Obesity, Improper body mechanics, Difficulty walking, Impaired perceived functional ability, Decreased balance  Visit Diagnosis: Chronic pain of left knee  Chronic pain of right knee  Other abnormalities of gait and mobility  Stiffness of left knee, not elsewhere classified  PHYSICAL THERAPY DISCHARGE SUMMARY  Visits from Start of Care: 7  Current functional level related to goals / functional outcomes: Improved knee    Remaining deficits: Continued neck pain    Education / Equipment: HEP   Plan: Patient agrees to discharge.  Patient goals were not met. Patient is being discharged due to not returning since the last visit.  ?????       Problem List Patient Active Problem List   Diagnosis Date Noted  . Difficult intubation   . Lipoma of posterior neck/upper back 12x9x6cm 03/27/2012  . Obesity, Class III, BMI 40-49.9 (morbid obesity) ([place] 03/27/2012    DCarney LivingPT DPT  01/29/2020, 12:41 PM  [facility]GP[address] [place] [address]Phone: [phone]  Fax:  [phone] Name: KNATISHA TRZCINSKIMRN: [id number] of Birth: 124-Jul-1982

## 2020-03-06 ENCOUNTER — Ambulatory Visit: Payer: Medicaid Other | Attending: Internal Medicine

## 2020-03-06 DIAGNOSIS — Z23 Encounter for immunization: Secondary | ICD-10-CM

## 2020-03-06 NOTE — Progress Notes (Signed)
   Covid-19 Vaccination Clinic  Name:  Jaime Riggs    MRN: [id number] DOB: [birth year]  03/06/2020  [name] was observed post Covid-19 immunization for 15 minutes without incident. She was provided with Vaccine Information Sheet and instruction to access the V-Safe system.   [name] was instructed to call 911 with any severe reactions post vaccine: Marland Kitchen Difficulty breathing  . Swelling of face and throat  . A fast heartbeat  . A bad rash all over body  . Dizziness and weakness   Immunizations Administered    Name Date Dose VIS Date Route   Pfizer COVID-19 Vaccine 03/06/2020  3:11 PM 0.3 mL 11/30/2019 Intramuscular   Manufacturer: [place]   Lot: [id number]   [place]: [id number]

## 2020-04-01 ENCOUNTER — Ambulatory Visit: Payer: Medicaid Other | Attending: Internal Medicine

## 2020-04-01 DIAGNOSIS — Z23 Encounter for immunization: Secondary | ICD-10-CM

## 2020-04-01 NOTE — Progress Notes (Signed)
   Covid-19 Vaccination Clinic  Name:  Jaime Riggs    MRN: [id number] DOB: November 19, 1981  04/01/2020  [name] was observed post Covid-19 immunization for 15 minutes without incident. She was provided with Vaccine Information Sheet and instruction to access the V-Safe system.   [name] was instructed to call 911 with any severe reactions post vaccine: Marland Kitchen Difficulty breathing  . Swelling of face and throat  . A fast heartbeat  . A bad rash all over body  . Dizziness and weakness   Immunizations Administered    Name Date Dose VIS Date Route   Pfizer COVID-19 Vaccine 04/01/2020  4:49 PM 0.3 mL 11/30/2019 Intramuscular   Manufacturer: [place]   Lot: [id number]   [place]: [id number]

## 2021-04-06 ENCOUNTER — Ambulatory Visit: Payer: Self-pay

## 2021-04-06 ENCOUNTER — Ambulatory Visit (INDEPENDENT_AMBULATORY_CARE_PROVIDER_SITE_OTHER): Payer: Medicaid Other

## 2021-04-06 ENCOUNTER — Ambulatory Visit (INDEPENDENT_AMBULATORY_CARE_PROVIDER_SITE_OTHER): Payer: Medicaid Other | Admitting: Orthopaedic Surgery

## 2021-04-06 ENCOUNTER — Other Ambulatory Visit: Payer: Self-pay

## 2021-04-06 DIAGNOSIS — G8929 Other chronic pain: Secondary | ICD-10-CM

## 2021-04-06 DIAGNOSIS — M25511 Pain in right shoulder: Secondary | ICD-10-CM

## 2021-04-06 DIAGNOSIS — M25512 Pain in left shoulder: Secondary | ICD-10-CM | POA: Diagnosis not present

## 2021-04-06 DIAGNOSIS — M542 Cervicalgia: Secondary | ICD-10-CM

## 2021-04-06 DIAGNOSIS — M25562 Pain in left knee: Secondary | ICD-10-CM | POA: Diagnosis not present

## 2021-04-06 MED ORDER — NABUMETONE 750 MG PO TABS: 750.0000 mg | ORAL_TABLET | Freq: Two times a day (BID) | ORAL | 3 refills | Status: AC | PRN

## 2021-04-06 MED ORDER — METHYLPREDNISOLONE ACETATE 40 MG/ML IJ SUSP
40.0000 mg | INTRAMUSCULAR | Status: AC | PRN
  Administered 2021-04-06: 40 mg via INTRA_ARTICULAR

## 2021-04-06 MED ORDER — LIDOCAINE HCL 1 % IJ SOLN
3.0000 mL | INTRAMUSCULAR | Status: AC | PRN
Start: 2021-04-06 — End: 2021-04-06
  Administered 2021-04-06: 3 mL

## 2021-04-06 NOTE — Progress Notes (Signed)
Office Visit Note   Patient: Jaime Riggs           Date of Birth: [birth year]           MRN: [id number] Visit Date: 04/06/2021              Requested by: Nolene Ebbs, MD [address] [place],  [place] [address] PCP: Nolene Ebbs, MD   Assessment & Plan: Visit Diagnoses:  1. Chronic left shoulder pain   2. Chronic right shoulder pain   3. Cervicalgia   4. Chronic pain of left knee     Plan: We definitely need to send her to physical therapy for her cervical spine.  She still needs work on weight loss and strengthening of both her quads.  We will try a knee brace if we get a definitive her left knee and I did provide a steroid injection in her left knee.  She understands the risk and benefits of these types of injections.  We will see her back in 6 weeks to see how therapy is done for her.  Follow-Up Instructions: Return in about 6 weeks (around 05/18/2021).   Orders:  Orders Placed This Encounter  Procedures  . Large Joint Inj  . XR Shoulder Left  . XR Shoulder Right   Meds ordered this encounter  Medications  . nabumetone (RELAFEN) 750 MG tablet    Sig: Take 1 tablet (750 mg total) by mouth 2 (two) times daily as needed.    Dispense:  60 tablet    Refill:  3      Procedures: Large Joint Inj: L knee on 04/06/2021 9:29 AM Indications: diagnostic evaluation and pain Details: 22 G 1.5 in needle, superolateral approach  Arthrogram: No  Medications: 3 mL lidocaine 1 %; 40 mg methylPREDNISolone acetate 40 MG/ML Outcome: tolerated well, no immediate complications Procedure, treatment alternatives, risks and benefits explained, specific risks discussed. Consent was given by the patient. Immediately prior to procedure a time out was called to verify the correct patient, procedure, equipment, support staff and site/side marked as required. Patient was prepped and draped in the usual sterile fashion.       Clinical Data: No additional  findings.   Subjective: Chief Complaint  Patient presents with  . Right Shoulder - Pain  . Left Shoulder - Pain  The patient comes in today with chief complaint of shoulder pain but she really points to her neck as a source of her pain in the trapezius area on both sides.  She actually has an MRI last year to Dr. Christella Noa from neurosurgery performed of her cervical spine that did not show any foraminal stenosis.  There is some mild disc bulging.  She is never had physical therapy on her neck.  She does have a history of bilateral carpal tunnel surgery that he is performed.  I seen her for her left knee before.  She is someone who is morbidly obese but did report gastric bypass surgery last year and she has lost about 50 pounds since then.  She does still complain of pain in the back of her left knee that she had prior to that surgery.  We saw her last in January of last year and provided a steroid injection in her left knee.  She does request a brace for her left knee.  She has had no acute change in her medical status.  HPI  Review of Systems She currently denies a headache, chest pain, shortness of breath,  fever, chills, nausea, vomiting  Objective: Vital Signs: There were no vitals taken for this visit.  Physical Exam She is alert and orient x3 and in no acute distress Ortho Exam Examination of her cervical spine she has pain in the paraspinal muscles with significant stiffness with lateral rotation and bending.  Both shoulders move well and do not seem to have a lot of pain in either shoulder.  Both knees hyperextend and she has pain in the back of her knee on the left side. Specialty Comments:  No specialty comments available.  Imaging: XR Shoulder Left  Result Date: 04/06/2021 3 views left shoulder show no acute findings.  XR Shoulder Right  Result Date: 04/06/2021 3 views of the right shoulder show no acute findings.    PMFS History: Patient Active Problem List   Diagnosis  Date Noted  . Difficult intubation   . Lipoma of posterior neck/upper back 12x9x6cm 03/27/2012  . Obesity, Class III, BMI 40-49.9 (morbid obesity) ([place]) 03/27/2012   Past Medical History:  Diagnosis Date  . Asthma   . Diabetes mellitus without complication ([place])   . Difficult intubation 12/06/2019   TMJ; small mouth; limited neck extension; redundant soft tissue  . Epilepsy ([place])    last episode  two yrs.  Marland Kitchen GERD (gastroesophageal reflux disease)   . Migraines   . Neck mass   . Obesity (BMI 30-39.9) 03/27/2012  . PONV (postoperative nausea and vomiting)   . PTSD (post-traumatic stress disorder) 1992   from childhood experiences  . Shortness of breath    with preg with multiple only  . Sleep apnea     Family History  Problem Relation Age of Onset  . Cancer Maternal Aunt        unknown  . Cancer Maternal Grandfather        lung  . Cancer Paternal Grandmother        ovarian  . Other Neg Hx     Past Surgical History:  Procedure Laterality Date  . CARPAL TUNNEL RELEASE Left 12/06/2019   Procedure: CARPAL TUNNEL RELEASE-LEFT;  Surgeon: Ashok Pall, MD;  Location: [facility];  Service: Neurosurgery;  Laterality: Left;  CARPAL TUNNEL RELEASE-LEFT  . CESAREAN SECTION    . CESAREAN SECTION  12/04/2012   Procedure: CESAREAN SECTION;  Surgeon: Logan Bores, MD;  Location: [facility];  Service: Obstetrics;  Laterality: N/A;  repeat c/s-TWINS  . MOUTH SURGERY    . NECK MASS EXCISION  04/04/2012   pathology c/w lipoma   Social History   Occupational History  . Not on file  Tobacco Use  . Smoking status: Never Smoker  . Smokeless tobacco: Never Used  Vaping Use  . Vaping Use: Never used  Substance and Sexual Activity  . Alcohol use: Not Currently    Comment: occasional wine  . Drug use: No  . Sexual activity: Not on file

## 2021-04-21 ENCOUNTER — Ambulatory Visit: Payer: Medicaid Other | Admitting: Rehabilitative and Restorative Service Providers"

## 2021-04-29 ENCOUNTER — Ambulatory Visit: Payer: Medicaid Other | Attending: Orthopaedic Surgery | Admitting: Physical Therapy

## 2021-04-29 ENCOUNTER — Encounter: Payer: Self-pay | Admitting: Physical Therapy

## 2021-04-29 ENCOUNTER — Other Ambulatory Visit: Payer: Self-pay

## 2021-04-29 DIAGNOSIS — M25511 Pain in right shoulder: Secondary | ICD-10-CM | POA: Insufficient documentation

## 2021-04-29 DIAGNOSIS — M542 Cervicalgia: Secondary | ICD-10-CM | POA: Insufficient documentation

## 2021-04-29 DIAGNOSIS — M6281 Muscle weakness (generalized): Secondary | ICD-10-CM | POA: Diagnosis present

## 2021-04-29 DIAGNOSIS — R293 Abnormal posture: Secondary | ICD-10-CM | POA: Diagnosis present

## 2021-04-29 DIAGNOSIS — G8929 Other chronic pain: Secondary | ICD-10-CM | POA: Insufficient documentation

## 2021-04-29 DIAGNOSIS — M25512 Pain in left shoulder: Secondary | ICD-10-CM | POA: Insufficient documentation

## 2021-04-29 NOTE — Patient Instructions (Signed)
Access Code: [email or web address] URL: [email or web address] Date: 04/29/2021 Prepared by: Hilda Blades  Exercises Supine Chin Tucks on [employer] - 2 x daily - 7 x weekly - 10 reps - 10 hold Supine Shoulder Horizontal Abduction with Resistance - 2 x daily - 7 x weekly - 10 reps Shoulder External Rotation and Scapular Retraction with Resistance - 2 x daily - 7 x weekly - 10 reps Seated Scapular Retraction - 2 x daily - 7 x weekly - 10 reps - 5 hold Seated Cervical Sidebending Stretch - 2 x daily - 7 x weekly - 2 reps - 20 hold Seated Levator Scapulae Stretch - 2 x daily - 7 x weekly - 2 reps - 20 hold
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
## 2021-05-14 ENCOUNTER — Encounter: Payer: Self-pay | Admitting: Physical Therapy

## 2021-05-14 ENCOUNTER — Other Ambulatory Visit: Payer: Self-pay

## 2021-05-14 ENCOUNTER — Ambulatory Visit: Payer: Medicaid Other | Admitting: Physical Therapy

## 2021-05-14 DIAGNOSIS — M6281 Muscle weakness (generalized): Secondary | ICD-10-CM

## 2021-05-14 DIAGNOSIS — M542 Cervicalgia: Secondary | ICD-10-CM | POA: Diagnosis not present

## 2021-05-14 DIAGNOSIS — M25512 Pain in left shoulder: Secondary | ICD-10-CM

## 2021-05-14 DIAGNOSIS — G8929 Other chronic pain: Secondary | ICD-10-CM

## 2021-05-14 DIAGNOSIS — R293 Abnormal posture: Secondary | ICD-10-CM

## 2021-05-14 NOTE — Patient Instructions (Signed)
Access Code: [email or web address] URL: [email or web address] Date: 05/14/2021 Prepared by: Hilda Blades  Exercises Supine Chin Tucks on [employer] - 2 x daily - 7 x weekly - 10 reps - 10 hold Supine Shoulder Horizontal Abduction with Resistance - 2 x daily - 7 x weekly - 10 reps Shoulder External Rotation and Scapular Retraction with Resistance - 2 x daily - 7 x weekly - 10 reps Seated Scapular Retraction - 2 x daily - 7 x weekly - 10 reps - 5 hold Seated Cervical Sidebending Stretch - 2 x daily - 7 x weekly - 2 reps - 20 hold Seated Levator Scapulae Stretch - 2 x daily - 7 x weekly - 2 reps - 20 hold Sidelying Mid Thoracic Rotation - 2 x daily - 7 x weekly - 5 reps - 10 seconds hold

## 2021-05-14 NOTE — Therapy (Signed)
[place], [place], [address] Phone: [phone]   Fax:  [phone]  Physical Therapy Treatment  Patient Details  Name: Jaime Riggs MRN: [id number] Date of Birth: September 14, 1981 Referring Provider (PT): Mcarthur Rossetti, MD   Encounter Date: 05/14/2021   PT End of Session - 05/14/21 0811    Visit Number 2    Number of Visits 8    Date for PT Re-Evaluation 06/24/21    Authorization Type [employer]    Authorization Time Period 05/14/21 - 07/04/21    Authorization - Visit Number 1    Authorization - Number of Visits 8    PT Start Time 0820    PT Stop Time 0910    PT Time Calculation (min) 50 min    Activity Tolerance Patient tolerated treatment well    Behavior During Therapy [facility] for tasks assessed/performed           Past Medical History:  Diagnosis Date  . Asthma   . Diabetes mellitus without complication ([place])   . Difficult intubation 12/06/2019   TMJ; small mouth; limited neck extension; redundant soft tissue  . Epilepsy ([place])    last episode  two yrs.  Marland Kitchen GERD (gastroesophageal reflux disease)   . Migraines   . Neck mass   . Obesity (BMI 30-39.9) 03/27/2012  . PONV (postoperative nausea and vomiting)   . PTSD (post-traumatic stress disorder) 1992   from childhood experiences  . Shortness of breath    with preg with multiple only  . Sleep apnea     Past Surgical History:  Procedure Laterality Date  . CARPAL TUNNEL RELEASE Left 12/06/2019   Procedure: CARPAL TUNNEL RELEASE-LEFT;  Surgeon: Ashok Pall, MD;  Location: [place];  Service: Neurosurgery;  Laterality: Left;  CARPAL TUNNEL RELEASE-LEFT  . CESAREAN SECTION    . CESAREAN SECTION  12/04/2012   Procedure: CESAREAN SECTION;  Surgeon: Logan Bores, MD;  Location: [facility];  Service: Obstetrics;  Laterality: N/A;  repeat c/s-TWINS  . MOUTH SURGERY    . NECK MASS EXCISION  04/04/2012   pathology c/w lipoma    There  were no vitals filed for this visit.   Subjective Assessment - 05/14/21 0810    Subjective Patient reports neck has been really hurting and bothering her a lot more recently with no apparent reason. Also notes that she has trouble opening her jaw in the morning that she thinks is because of the TMJ.    Patient Stated Goals Get rid of the pain    Currently in Pain? Yes    Pain Score 6     Pain Location Neck    Pain Orientation Right;Left    Pain Descriptors / Indicators Tightness;Dull;Aching    Pain Type Chronic pain    Pain Onset More than a month ago    Pain Frequency Constant              [facility] PT Assessment - 05/14/21 0001      Posture/Postural Control   Posture Comments Round shoulders, foward head, elevated shoulders, generally slouched posture      AROM   Cervical Flexion 15    Cervical - Right Rotation 35    Cervical - Left Rotation 35                         [facility] Adult PT Treatment/Exercise - 05/14/21 0001      Exercises  Exercises Neck      Neck Exercises: Machines for Strengthening   UBE (Upper Arm Bike) L1 x 4 min (2 fwd/bwd)      Neck Exercises: Theraband   Shoulder External Rotation 10 reps    Shoulder External Rotation Limitations yellow, double er + scap retraction    Horizontal ABduction 10 reps    Horizontal ABduction Limitations yellow, supine      Neck Exercises: Supine   Neck Retraction 10 reps;10 secs    Neck Retraction Limitations 2 pillows under head      Modalities   Modalities Moist Heat;Electrical Stimulation      Moist Heat Therapy   Number Minutes Moist Heat 10 Minutes   combination with e-stim   Moist Heat Location Cervical      Electrical Stimulation   Electrical Stimulation Location Bilateral upper trap    Electrical Stimulation Action Pre-mod x 10 min    Electrical Stimulation Parameters 80-150    Electrical Stimulation Goals Pain      Manual Therapy   Manual Therapy Myofascial release;Passive ROM;Soft  tissue mobilization    Soft tissue mobilization IASTM for bilateral upper trap region    Myofascial Release Suboccipital release with gentle manual traction    Passive ROM Manual upper trap and levator stretching in supine      Neck Exercises: Stretches   Upper Trapezius Stretch 20 seconds    Levator Stretch 20 seconds    Other Neck Stretches Sidelying thoracic rotation 5 x 10 sec each                  PT Education - 05/14/21 0810    Education Details HEP update, home TENS use    Person(s) Educated Patient    Methods Explanation;Demonstration;Verbal cues;Handout;Tactile cues    Comprehension Verbalized understanding;Need further instruction;Returned demonstration;Verbal cues required;Tactile cues required            PT Short Term Goals - 04/29/21 0934      PT SHORT TERM GOAL #1   Title Patient will be I with initial HEP to progress with PT    Baseline provided at evaluation    Time 4    Period Weeks    Status New    Target Date 05/27/21      PT SHORT TERM GOAL #2   Title Patient will report resting neck/shoulder pain </= 5/10 to improve ability to get comfortable and sleep    Baseline 7/10 at rest    Time 4    Period Weeks    Status New    Target Date 05/27/21      PT SHORT TERM GOAL #3   Title Patient will demonstrate >/= 40 deg cervical rotation bilaterally to indicate reduce muscule tension and improve driving    Baseline bilateral cervical rotation 30 deg    Time 4    Period Weeks    Status New    Target Date 05/27/21      PT SHORT TERM GOAL #4   Title Patient will be able to demonstrate and verbalize proper posture to reduce cervical and shoulder tension    Baseline patient educated on proper posture at eval    Time 4    Period Weeks    Status New    Target Date 05/27/21             PT Long Term Goals - 04/29/21 0937      PT LONG TERM GOAL #1   Title Patient will be  I with final HEP to maintain progress from PT    Baseline provided at eval     Time 8    Period Weeks    Status New    Target Date 06/24/21      PT LONG TERM GOAL #2   Title Patient will demonstrate improve cervical rotation >/= 60 deg to improve driving safety    Baseline cervical rotation 30 deg bilaterally    Time 8    Period Weeks    Status New    Target Date 06/24/21      PT LONG TERM GOAL #3   Title Patient will exhibit improved periscapular and shoulder strength >/= 4/5 MMT to improve ability to maintain posture and reduce pain    Baseline Periscpaular and shoulder strength grossly 4-/5 MMT    Time 8    Period Weeks    Status New    Target Date 06/24/21      PT LONG TERM GOAL #4   Title Patient will report ability to perform cleaning tasks with </=4/10 pain level    Baseline patient reports >/= 7/10 pain with activity    Time 8    Period Weeks    Status New    Target Date 06/24/21      PT LONG TERM GOAL #5   Title Patient will be able to sit >/= 1 hour without having to get up to improve performing leisure activities    Baseline unable to sit > 30 minutes    Time 8    Period Weeks    Status New    Target Date 06/24/21                 Plan - 05/14/21 0811    Clinical Impression Statement Patient tolerated therapy well with no adverse effects. She continues to report constant neck and shoulder pain. Patient did note slight improvement in symptoms following manual therapy and stretching. Reviewed HEP and updated to included to include thoracic mobility. Trialed TENS with [facility] post therapy to see if patient has any relief for possible personal purchase and use at home, this was not billed due to patient insurance. Patient would benefit from continued skilled PT in order to reduce pain, improve postural control and cervical range of motion, and maximize her functional level.    PT Treatment/Interventions ADLs/Self Care Home Management;Aquatic Therapy;Cryotherapy;Electrical Stimulation;Iontophoresis 4mg /ml Dexamethasone;Moist  Heat;Ultrasound;Traction;Neuromuscular re-education;Balance training;Therapeutic exercise;Therapeutic activities;Functional mobility training;Stair training;Gait training;Patient/family education;Manual techniques;Dry needling;Passive range of motion;Taping;Vasopneumatic Device;Spinal Manipulations;Joint Manipulations    PT Next Visit Plan Review HEP and progress PRN, manual/dry needling and stretching for cervical and upper trap region, postural strengthening    PT Home Exercise Plan [id number]    Consulted and Agree with Plan of Care Patient           Patient will benefit from skilled therapeutic intervention in order to improve the following deficits and impairments:  Decreased range of motion,Increased muscle spasms,Pain,Decreased activity tolerance,Postural dysfunction,Decreased strength,Impaired flexibility  Visit Diagnosis: Cervicalgia  Chronic left shoulder pain  Chronic right shoulder pain  Muscle weakness (generalized)  Abnormal posture     Problem List Patient Active Problem List   Diagnosis Date Noted  . Difficult intubation   . Lipoma of posterior neck/upper back 12x9x6cm 03/27/2012  . Obesity, Class III, BMI 40-49.9 (morbid obesity) ([place]) 03/27/2012    Hilda Blades, PT, DPT, LAT, ATC 05/14/21  9:12 AM Phone: [phone] Fax: [facility] [address]  [place], [place], [address] Phone: [phone]   Fax:  [phone]  Name: Jaime Riggs MRN: [id number] Date of Birth: 05/02/1981

## 2021-05-19 ENCOUNTER — Other Ambulatory Visit: Payer: Self-pay

## 2021-05-19 ENCOUNTER — Ambulatory Visit (INDEPENDENT_AMBULATORY_CARE_PROVIDER_SITE_OTHER): Payer: Medicaid Other | Admitting: Orthopaedic Surgery

## 2021-05-19 ENCOUNTER — Encounter: Payer: Self-pay | Admitting: Orthopaedic Surgery

## 2021-05-19 DIAGNOSIS — M546 Pain in thoracic spine: Secondary | ICD-10-CM | POA: Diagnosis not present

## 2021-05-19 NOTE — Progress Notes (Signed)
The patient returns in for follow-up as a relates to her cervical spine, her upper thoracic spine between her shoulder blades as well as her left knee.  I did inject her left knee and she said this is very helpful.  She is only had 2 therapy sessions.  Her main chief complaint is the pain she has between her shoulder blades.  She is a diabetic but reports good control.  We have her on anti-inflammatories.  She is also taken a muscle relaxant and gabapentin.  She still denies any numbness and tingling in her upper extremities.  On exam her pain is mainly just between the shoulder blades to the upper thoracic spine.  The upper extremity exams bilaterally are normal.  I will have her continue physical therapy just concentrating on thoracic spine area and neck area.  Any modalities per the therapist discretion.  We will see her back in 4 weeks for follow-up.  We may end up recommending a repeat MRI of the thoracic spine since that was done a year and a half ago if she continues to have radicular symptoms.

## 2021-05-20 ENCOUNTER — Encounter: Payer: Self-pay | Admitting: Physical Therapy

## 2021-05-20 ENCOUNTER — Other Ambulatory Visit: Payer: Self-pay

## 2021-05-20 ENCOUNTER — Ambulatory Visit: Payer: Medicaid Other | Attending: Orthopaedic Surgery | Admitting: Physical Therapy

## 2021-05-20 DIAGNOSIS — M542 Cervicalgia: Secondary | ICD-10-CM | POA: Insufficient documentation

## 2021-05-20 DIAGNOSIS — M6281 Muscle weakness (generalized): Secondary | ICD-10-CM | POA: Diagnosis present

## 2021-05-20 DIAGNOSIS — M25511 Pain in right shoulder: Secondary | ICD-10-CM | POA: Diagnosis present

## 2021-05-20 DIAGNOSIS — G8929 Other chronic pain: Secondary | ICD-10-CM | POA: Diagnosis present

## 2021-05-20 DIAGNOSIS — M25512 Pain in left shoulder: Secondary | ICD-10-CM | POA: Insufficient documentation

## 2021-05-20 DIAGNOSIS — R293 Abnormal posture: Secondary | ICD-10-CM | POA: Insufficient documentation

## 2021-05-20 NOTE — Therapy (Addendum)
[place], [place], [address] Phone: [phone]   Fax:  [phone]  Physical Therapy Treatment / discharge  Patient Details  Name: Jaime Riggs MRN: [id number] Date of Birth: 03/01/1981 Referring Provider (PT): Mcarthur Rossetti, MD   Encounter Date: 05/20/2021   PT End of Session - 05/20/21 0816     Visit Number 3    Number of Visits 8    Date for PT Re-Evaluation 06/24/21    Authorization Type [employer]    Authorization Time Period 05/14/21 - 07/04/21    Authorization - Visit Number 2    Authorization - Number of Visits 8    PT Start Time [phone]   pt arrived late today   PT Stop Time 0843    PT Time Calculation (min) 29 min    Activity Tolerance Patient tolerated treatment well             Past Medical History:  Diagnosis Date   Asthma    Diabetes mellitus without complication ([place])    Difficult intubation 12/06/2019   TMJ; small mouth; limited neck extension; redundant soft tissue   Epilepsy ([place])    last episode  two yrs.   GERD (gastroesophageal reflux disease)    Migraines    Neck mass    Obesity (BMI 30-39.9) 03/27/2012   PONV (postoperative nausea and vomiting)    PTSD (post-traumatic stress disorder) 1992   from childhood experiences   Shortness of breath    with preg with multiple only   Sleep apnea     Past Surgical History:  Procedure Laterality Date   CARPAL TUNNEL RELEASE Left 12/06/2019   Procedure: CARPAL TUNNEL RELEASE-LEFT;  Surgeon: Ashok Pall, MD;  Location: [facility];  Service: Neurosurgery;  Laterality: Left;  CARPAL TUNNEL RELEASE-LEFT   CESAREAN SECTION     CESAREAN SECTION  12/04/2012   Procedure: CESAREAN SECTION;  Surgeon: Logan Bores, MD;  Location: [facility];  Service: Obstetrics;  Laterality: N/A;  repeat c/s-TWINS   MOUTH SURGERY     NECK MASS EXCISION  04/04/2012   pathology c/w lipoma    There were no vitals filed for this visit.    Subjective Assessment - 05/20/21 0816     Subjective " I saw the MD yesterdya and he wants me to focus on the mid upper back. Today my pain is a 6/10 and its right between my shoulder blades."    Patient Stated Goals Get rid of the pain    Currently in Pain? Yes    Pain Score 6     Pain Location Thoracic    Pain Orientation Mid    Pain Descriptors / Indicators Aching;Tightness    Pain Type Chronic pain    Pain Onset More than a month ago    Pain Frequency Constant    Aggravating Factors  any activity    Pain Relieving Factors massage, ice and medication                [facility] PT Assessment - 05/20/21 0001       Assessment   Medical Diagnosis Cervicalgia    Referring Provider (PT) Mcarthur Rossetti, MD                           [facility] Adult PT Treatment/Exercise - 05/20/21 0001       Manual Therapy   Manual Therapy Joint mobilization  Manual therapy comments skilled palpation and monitoring duing TPDN    Joint Mobilization T1-T7 PA grade III-IV    Soft tissue mobilization IASTM for bilateral upper trap region      Neck Exercises: Stretches   Upper Trapezius Stretch 2 reps;30 seconds;Left    Levator Stretch Left;2 reps;30 seconds    Other Neck Stretches rhomboid stretch 2 x 30 sec              Trigger Point Dry Needling - 05/20/21 0001     Consent Given? Yes    Education Handout Provided Yes    Muscles Treated Back/Hip Thoracic multifidi    Thoracic multifidi response Twitch response elicited;Palpable increased muscle length   T4 bil                   PT Short Term Goals - 04/29/21 0934       PT SHORT TERM GOAL #1   Title Patient will be I with initial HEP to progress with PT    Baseline provided at evaluation    Time 4    Period Weeks    Status New    Target Date 05/27/21      PT SHORT TERM GOAL #2   Title Patient will report resting neck/shoulder pain </= 5/10 to improve ability to get comfortable and sleep     Baseline 7/10 at rest    Time 4    Period Weeks    Status New    Target Date 05/27/21      PT SHORT TERM GOAL #3   Title Patient will demonstrate >/= 40 deg cervical rotation bilaterally to indicate reduce muscule tension and improve driving    Baseline bilateral cervical rotation 30 deg    Time 4    Period Weeks    Status New    Target Date 05/27/21      PT SHORT TERM GOAL #4   Title Patient will be able to demonstrate and verbalize proper posture to reduce cervical and shoulder tension    Baseline patient educated on proper posture at eval    Time 4    Period Weeks    Status New    Target Date 05/27/21               PT Long Term Goals - 04/29/21 0937       PT LONG TERM GOAL #1   Title Patient will be I with final HEP to maintain progress from PT    Baseline provided at eval    Time 8    Period Weeks    Status New    Target Date 06/24/21      PT LONG TERM GOAL #2   Title Patient will demonstrate improve cervical rotation >/= 60 deg to improve driving safety    Baseline cervical rotation 30 deg bilaterally    Time 8    Period Weeks    Status New    Target Date 06/24/21      PT LONG TERM GOAL #3   Title Patient will exhibit improved periscapular and shoulder strength >/= 4/5 MMT to improve ability to maintain posture and reduce pain    Baseline Periscpaular and shoulder strength grossly 4-/5 MMT    Time 8    Period Weeks    Status New    Target Date 06/24/21      PT LONG TERM GOAL #4   Title Patient will report ability to perform cleaning tasks with </=4/10  pain level    Baseline patient reports >/= 7/10 pain with activity    Time 8    Period Weeks    Status New    Target Date 06/24/21      PT LONG TERM GOAL #5   Title Patient will be able to sit >/= 1 hour without having to get up to improve performing leisure activities    Baseline unable to sit > 30 minutes    Time 8    Period Weeks    Status New    Target Date 06/24/21                    Plan - 05/20/21 0825     Clinical Impression Statement limited session due to pt arriving late. educated and consent was given for TPDN focusing on the upper thoracic multifidi followed with IASTM techniques and upper thoracic mobs. worked on Programme researcher, broadcasting/film/video and followed with stretch for upper trap/. levator scapulae and rhomboids. end of session she noted feeling sore but that it dropped form a 6 to a 4/10.    PT Treatment/Interventions ADLs/Self Care Home Management;Aquatic Therapy;Cryotherapy;Electrical Stimulation;Iontophoresis 39m/ml Dexamethasone;Moist Heat;Ultrasound;Traction;Neuromuscular re-education;Balance training;Therapeutic exercise;Therapeutic activities;Functional mobility training;Stair training;Gait training;Patient/family education;Manual techniques;Dry needling;Passive range of motion;Taping;Vasopneumatic Device;Spinal Manipulations;Joint Manipulations    PT Next Visit Plan Review HEP and progress PRN, response to DN and stretching for cervical and upper trap region, postural strengthening    PT Home Exercise Plan [id number]    Consulted and Agree with Plan of Care Patient             Patient will benefit from skilled therapeutic intervention in order to improve the following deficits and impairments:  Decreased range of motion,Increased muscle spasms,Pain,Decreased activity tolerance,Postural dysfunction,Decreased strength,Impaired flexibility  Visit Diagnosis: Cervicalgia  Chronic left shoulder pain  Chronic right shoulder pain  Muscle weakness (generalized)  Abnormal posture     Problem List Patient Active Problem List   Diagnosis Date Noted   Difficult intubation    Lipoma of posterior neck/upper back 12x9x6cm 03/27/2012   Obesity, Class III, BMI 40-49.9 (morbid obesity) ([place] 03/27/2012    Espn Zeman PT, DPT, LAT, ATC  05/20/21  8:43 AM      [facility]GWickliffe [place] [address]Phone: [phone]  Fax:  [phone] Name: Jaime MASSINGALEMRN: [id number] of Birth: 1April 27, 1982   PHYSICAL THERAPY DISCHARGE SUMMARY  Visits from Start of Care: 3  Current functional level related to goals / functional outcomes: See goals   Remaining deficits: Current status unknown   Education / Equipment: HEP   Patient agrees to discharge. Patient goals were not met. Patient is being discharged due to not returning since the last visit.  Payslee Bateson PT, DPT, LAT, ATC  07/23/21  11:09 AM

## 2021-05-28 ENCOUNTER — Ambulatory Visit: Payer: Medicaid Other | Admitting: Physical Therapy

## 2021-05-28 ENCOUNTER — Other Ambulatory Visit: Payer: Self-pay

## 2021-05-28 ENCOUNTER — Ambulatory Visit (HOSPITAL_COMMUNITY)
Admission: EM | Admit: 2021-05-28 | Discharge: 2021-05-28 | Disposition: A | Discharge status: Home / Self Care | Payer: Medicaid Other | Attending: Family Medicine | Admitting: Family Medicine

## 2021-05-28 DIAGNOSIS — Z20822 Contact with and (suspected) exposure to covid-19: Secondary | ICD-10-CM | POA: Diagnosis present

## 2021-05-28 NOTE — ED Triage Notes (Signed)
Pt is here for COVID testing. Declined being seen by Medical Provider.

## 2021-05-29 LAB — SARS CORONAVIRUS 2 (TAT 6-24 HRS): SARS Coronavirus 2: POSITIVE — AB

## 2021-06-03 ENCOUNTER — Ambulatory Visit: Payer: Medicaid Other | Admitting: Physical Therapy

## 2021-06-15 ENCOUNTER — Other Ambulatory Visit: Payer: Self-pay | Admitting: Physician Assistant

## 2021-06-15 DIAGNOSIS — Z9884 Bariatric surgery status: Secondary | ICD-10-CM

## 2021-06-15 DIAGNOSIS — Z6841 Body Mass Index (BMI) 40.0 and over, adult: Secondary | ICD-10-CM

## 2021-06-16 ENCOUNTER — Ambulatory Visit: Payer: Medicaid Other | Admitting: Orthopaedic Surgery

## 2021-06-17 ENCOUNTER — Ambulatory Visit
Admission: RE | Admit: 2021-06-17 | Discharge: 2021-06-17 | Disposition: A | Discharge status: Home / Self Care | Payer: Medicaid Other | Source: Ambulatory Visit | Attending: Physician Assistant | Admitting: Physician Assistant

## 2021-06-17 DIAGNOSIS — Z9884 Bariatric surgery status: Secondary | ICD-10-CM

## 2021-06-17 DIAGNOSIS — Z6841 Body Mass Index (BMI) 40.0 and over, adult: Secondary | ICD-10-CM

## 2021-07-01 ENCOUNTER — Ambulatory Visit: Payer: Medicaid Other | Admitting: Orthopaedic Surgery

## 2021-07-15 ENCOUNTER — Encounter: Payer: Self-pay | Admitting: Women's Health

## 2021-07-15 ENCOUNTER — Ambulatory Visit (INDEPENDENT_AMBULATORY_CARE_PROVIDER_SITE_OTHER): Payer: Medicaid Other | Admitting: Women's Health

## 2021-07-15 ENCOUNTER — Other Ambulatory Visit (HOSPITAL_COMMUNITY)
Admission: RE | Admit: 2021-07-15 | Discharge: 2021-07-15 | Disposition: A | Discharge status: Home / Self Care | Payer: Medicaid Other | Source: Ambulatory Visit | Attending: Women's Health | Admitting: Women's Health

## 2021-07-15 ENCOUNTER — Other Ambulatory Visit: Payer: Self-pay

## 2021-07-15 VITALS — BP 151/97 | HR 83 | Ht 68.0 in | Wt 316.0 lb

## 2021-07-15 DIAGNOSIS — Z113 Encounter for screening for infections with a predominantly sexual mode of transmission: Secondary | ICD-10-CM | POA: Diagnosis not present

## 2021-07-15 DIAGNOSIS — Z124 Encounter for screening for malignant neoplasm of cervix: Secondary | ICD-10-CM

## 2021-07-15 DIAGNOSIS — G5600 Carpal tunnel syndrome, unspecified upper limb: Secondary | ICD-10-CM

## 2021-07-15 DIAGNOSIS — Z01419 Encounter for gynecological examination (general) (routine) without abnormal findings: Secondary | ICD-10-CM

## 2021-07-15 DIAGNOSIS — R1906 Epigastric swelling, mass or lump: Secondary | ICD-10-CM

## 2021-07-15 DIAGNOSIS — M069 Rheumatoid arthritis, unspecified: Secondary | ICD-10-CM

## 2021-07-15 DIAGNOSIS — I10 Essential (primary) hypertension: Secondary | ICD-10-CM

## 2021-07-15 DIAGNOSIS — Z1231 Encounter for screening mammogram for malignant neoplasm of breast: Secondary | ICD-10-CM | POA: Diagnosis not present

## 2021-07-15 DIAGNOSIS — F4310 Post-traumatic stress disorder, unspecified: Secondary | ICD-10-CM

## 2021-07-15 DIAGNOSIS — E119 Type 2 diabetes mellitus without complications: Secondary | ICD-10-CM | POA: Insufficient documentation

## 2021-07-15 DIAGNOSIS — Z8669 Personal history of other diseases of the nervous system and sense organs: Secondary | ICD-10-CM

## 2021-07-15 DIAGNOSIS — F410 Panic disorder [episodic paroxysmal anxiety] without agoraphobia: Secondary | ICD-10-CM

## 2021-07-15 NOTE — Progress Notes (Signed)
GYNECOLOGY ANNUAL PREVENTATIVE CARE ENCOUNTER NOTE  History:     Jaime Riggs is a 40 y.o. G31P2003 female here for a routine annual gynecologic exam.  Current complaints: none.   Denies abnormal vaginal bleeding, discharge, pelvic pain, problems with intercourse or other gynecologic concerns. Pt requests STD testing today. Pt reports she does not perform SBE. Pt denies bowel or bladder concerns. No family hx of breast, colon, or ovarian cancer. Paternal grandmother had uterine cancer age 30 - in remission. Pt does not smoke, drink or use drugs.      Gynecologic History No LMP recorded. (Menstrual status: Oral contraceptives). Menstruation: none, pt uses OCP continuously Contraception: OCP (estrogen/progesterone). Pt reports she does not desire pregnancy in the next year. Last Pap: 7 years. Results were: normal per patient. Pt denies history of abnormal Pap. Last mammogram: 07/2019. Results were: normal  Obstetric History OB History  Gravida Para Term Preterm AB Living  '2 2 2     3  '$ SAB IAB Ectopic Multiple Live Births        1 2    # Outcome Date GA Lbr Len/2nd Weight Sex Delivery Anes PTL Lv  2A Term 12/04/12 [redacted]w[redacted]d 6 lb 1.2 oz (2.756 kg) M CS-LTranv Spinal  LIV  2B Term 12/04/12 373w0d5 lb 14.5 oz (2.68 kg) F CS-LTranv Spinal  LIV  1 Term 07/16/09    M CS-Unspec       Past Medical History:  Diagnosis Date   Asthma    Diabetes mellitus without complication ([facility]   Difficult intubation 12/06/2019   TMJ; small mouth; limited neck extension; redundant soft tissue   [place]ilepsy [place]   last episode  two yrs.   GERD (gastroesophageal reflux disease)    Migraines    Neck mass    Obesity (BMI 30-39.9) 03/27/2012   PONV (postoperative nausea and vomiting)    PTSD (post-traumatic stress disorder) 1992   from childhood experiences   Shortness of breath    with preg with multiple only   Sleep apnea     Past Surgical History:  Procedure Laterality Date   CARPAL  TUNNEL RELEASE Left 12/06/2019   Procedure: CARPAL TUNNEL RELEASE-LEFT;  Surgeon: CaAshok PallMD;  [place] [facility] Service: Neurosurgery;  Laterality: Left;  CARPAL TUNNEL RELEASE-LEFT   CESAREAN SECTION     CESAREAN SECTION  12/04/2012   Procedure: CESAREAN SECTION;  Surgeon: KaLogan BoresMD;  [facility];  Service: Obstetrics;  Laterality: N/A;  repeat c/s-TWINS   MOUTH SURGERY     NECK MASS EXCISION  04/04/2012   pathology c/w lipoma    Current Outpatient Medications on File Prior to Visit  Medication Sig Dispense Refill   albuterol (VENTOLIN HFA) 108 (90 Base) MCG/ACT inhaler Inhale 2 puffs into the lungs every 6 (six) hours as needed for wheezing or shortness of breath.      ALPRAZolam (XANAX) 0.5 MG tablet Take 0.25-0.5 mg by mouth 2 (two) times daily as needed for anxiety.      cetirizine (ZYRTEC) 10 MG tablet Take 10 mg by mouth at bedtime.      cyclobenzaprine (FLEXERIL) 5 MG tablet Take 1 tablet (5 mg total) by mouth at bedtime as needed for muscle spasms. (Patient taking differently: Take 5 mg by mouth at bedtime.) 10 tablet 0   JUNEL 1.5/30 1.5-30 MG-MCG tablet Take 1 tablet by mouth daily.      Multiple Vitamins-Minerals (MULTIVITAMIN WITH MINERALS) tablet  Take 1 tablet by mouth daily.     nabumetone (RELAFEN) 750 MG tablet Take 1 tablet (750 mg total) by mouth 2 (two) times daily as needed. 60 tablet 3   sertraline (ZOLOFT) 100 MG tablet Take 200 mg by mouth at bedtime.     acetaminophen (TYLENOL) 325 MG tablet Take 650 mg by mouth every 6 (six) hours as needed.     FEROSUL 325 (65 Fe) MG tablet Take 325 mg by mouth daily.     metFORMIN (GLUCOPHAGE) 500 MG tablet Take 500 mg by mouth 2 (two) times daily with a meal.      No current facility-administered medications on file prior to visit.    No Known Allergies  Social History:  reports that she has never smoked. She has never used smokeless tobacco. She reports previous alcohol use. She reports that she does  not use drugs.  Family History  Problem Relation Age of Onset   Cancer Maternal Aunt        unknown   Cancer Maternal Grandfather        lung   Cancer Paternal Grandmother        ovarian   Other Neg Hx     The following portions of the patient's history were reviewed and updated as appropriate: allergies, current medications, past family history, past medical history, past social history, past surgical history and problem list.  Review of Systems Pertinent items noted in HPI and remainder of comprehensive ROS otherwise negative.  Physical Exam:  BP (!) 151/97   Pulse 83   Ht '5\' 8"'$  (1.727 m)   Wt (!) 316 lb (143.3 kg)   BMI 48.05 kg/m   CONSTITUTIONAL: Well-developed, well-nourished female in no acute distress.  HENT:  Normocephalic, atraumatic, External right and left ear normal. EYES: Conjunctivae and EOM are normal. Pupils are equal, round, and reactive to light. No scleral icterus.  NECK: Normal range of motion, supple, no masses.  Normal thyroid.  SKIN: Skin is warm and dry. No rash noted. Not diaphoretic. No erythema. No pallor. MUSCULOSKELETAL: Normal range of motion. No tenderness.  No cyanosis, clubbing, or edema. NEUROLOGIC: Alert and oriented to person, place, and time. Normal reflexes, muscle tone coordination. PSYCHIATRIC: Normal mood and affect. Normal behavior. Normal judgment and thought content. CARDIOVASCULAR: Normal heart rate noted, regular rhythm. RESPIRATORY: Clear to auscultation bilaterally. Effort and breath sounds normal, no problems with respiration noted. BREASTS: Symmetric in size. No masses, skin changes, nipple drainage, or lymphadenopathy. ABDOMEN: Soft, normal bowel sounds, no distention noted.  No tenderness, rebound or guarding.  PELVIC: Normal appearing external genitalia; normal appearing vaginal mucosa and cervix.  No abnormal discharge noted.  Pap smear obtained.   Assessment and Plan:  1. Well woman exam - pt ineligible for [facility] with  estrogen d/t HTN, age 40, BMI 48.05, MWA - pt to provide complete list of medications and PMH/PSH to provider prior to prescribing, as patient was unsure of entire medication list - pt also reports sees PCP monthly, has next appt in two weeks, will discuss BP at that time. Per consultation with Dr. Rip Harbour, pt does not need to be started on BP meds here. Discussed s/sx of hypertensive urgency/emergency and when to present to ED. - Cytology - PAP( [place])  2. Screening for cervical cancer - Cytology - PAP( [place])  3. Encounter for screening mammogram for malignant neoplasm of breast - MM Digital Screening; Future  4. Routine screening for STI (sexually transmitted infection) -  Cytology - PAP( [place]) - HIV antibody (with reflex) - [facility] - Hepatitis B Surface AntiGEN - Hepatitis C Antibody  5. Epigastric mass - not palpable on exam, pt reports is present sometimes when coughing, requests additional evaluation - US Abdomen Complete; Future  6. Hx of migraines -likely with aura  7. Rheumatoid arthritis, involving unspecified site, unspecified whether rheumatoid factor present ([place])  8. Panic attacks  9. Carpal tunnel syndrome, unspecified laterality  10. PTSD (post-traumatic stress disorder)  Will follow up results of pap smear and other testing, if performed, and manage accordingly. Mammogram ordered Routine preventative health maintenance measures emphasized. Self-breast awareness taught, importance discussed, advised when to RTC, SBA literature given. Please refer to After Visit Summary for other counseling recommendations.      Stacey Drain, [facility] Women's Health Nurse Practitioner, [facility] for [employer], [place]

## 2021-07-15 NOTE — Patient Instructions (Signed)
Preventive Care 68-40 Years Old, Female Preventive care refers to lifestyle choices and visits with your health care provider that can promote health and wellness. This includes: A yearly physical exam. This is also called an annual wellness visit. Regular dental and eye exams. Immunizations. Screening for certain conditions. Healthy lifestyle choices, such as: Eating a healthy diet. Getting regular exercise. Not using drugs or products that contain nicotine and tobacco. Limiting alcohol use. What can I expect for my preventive care visit? Physical exam Your health care provider will check your: Height and weight. These may be used to calculate your BMI (body mass index). BMI is a measurement that tells if you are at a healthy weight. Heart rate and blood pressure. Body temperature. Skin for abnormal spots. Counseling Your health care provider may ask you questions about your: Past medical problems. Family's medical history. Alcohol, tobacco, and drug use. Emotional well-being. Home life and relationship well-being. Sexual activity. Diet, exercise, and sleep habits. Work and work Statistician. Access to firearms. Method of birth control. Menstrual cycle. Pregnancy history. What immunizations do I need?  Vaccines are usually given at various ages, according to a schedule. Your health care provider will recommend vaccines for you based on your age, medicalhistory, and lifestyle or other factors, such as travel or where you work. What tests do I need? Blood tests Lipid and cholesterol levels. These may be checked every 5 years, or more often if you are over 40 years old. Hepatitis C test. Hepatitis B test. Screening Lung cancer screening. You may have this screening every year starting at age 30 if you have a 30-pack-year history of smoking and currently smoke or have quit within the past 15 years. Colorectal cancer screening. All adults should have this screening starting at  age 40 and continuing until age 3. Your health care provider may recommend screening at age 40 if you are at increased risk. You will have tests every 1-10 years, depending on your results and the type of screening test. Diabetes screening. This is done by checking your blood sugar (glucose) after you have not eaten for a while (fasting). You may have this done every 1-3 years. Mammogram. This may be done every 1-2 years. Talk with your health care provider about when you should start having regular mammograms. This may depend on whether you have a family history of breast cancer. BRCA-related cancer screening. This may be done if you have a family history of breast, ovarian, tubal, or peritoneal cancers. Pelvic exam and Pap test. This may be done every 3 years starting at age 40. Starting at age 54, this may be done every 5 years if you have a Pap test in combination with an HPV test. Other tests STD (sexually transmitted disease) testing, if you are at risk. Bone density scan. This is done to screen for osteoporosis. You may have this scan if you are at high risk for osteoporosis. Talk with your health care provider about your test results, treatment options,and if necessary, the need for more tests. Follow these instructions at home: Eating and drinking  Eat a diet that includes fresh fruits and vegetables, whole grains, lean protein, and low-fat dairy products. Take vitamin and mineral supplements as recommended by your health care provider. Do not drink alcohol if: Your health care provider tells you not to drink. You are pregnant, may be pregnant, or are planning to become pregnant. If you drink alcohol: Limit how much you have to 0-1 drink a day. Be aware  of how much alcohol is in your drink. In the [place]., one drink equals one 12 oz bottle of beer (355 mL), one 5 oz glass of wine (148 mL), or one 1 oz glass of hard liquor (44 mL).  Lifestyle Take daily care of your teeth and  gums. Brush your teeth every morning and night with fluoride toothpaste. Floss one time each day. Stay active. Exercise for at least 30 minutes 5 or more days each week. Do not use any products that contain nicotine or tobacco, such as cigarettes, e-cigarettes, and chewing tobacco. If you need help quitting, ask your health care provider. Do not use drugs. If you are sexually active, practice safe sex. Use a condom or other form of protection to prevent STIs (sexually transmitted infections). If you do not wish to become pregnant, use a form of birth control. If you plan to become pregnant, see your health care provider for a prepregnancy visit. If told by your health care provider, take low-dose aspirin daily starting at age 50. Find healthy ways to cope with stress, such as: Meditation, yoga, or listening to music. Journaling. Talking to a trusted person. Spending time with friends and family. Safety Always wear your seat belt while driving or riding in a vehicle. Do not drive: If you have been drinking alcohol. Do not ride with someone who has been drinking. When you are tired or distracted. While texting. Wear a helmet and other protective equipment during sports activities. If you have firearms in your house, make sure you follow all gun safety procedures. What's next? Visit your health care provider once a year for an annual wellness visit. Ask your health care provider how often you should have your eyes and teeth checked. Stay up to date on all vaccines. This information is not intended to replace advice given to you by your health care provider. Make sure you discuss any questions you have with your healthcare provider. Document Revised: 09/09/2020 Document Reviewed: 08/17/2018 Elsevier Patient Education  2022 Elsevier Inc.   Breast Self-Awareness Breast self-awareness means being familiar with how your breasts look and feel. It involves checking your breasts regularly and  reporting any changes to yourhealth care provider. Practicing breast self-awareness is important. Sometimes changes may not be harmful (are benign), but sometimes a change in your breasts can be a sign of a serious medical problem. It is important to learn how to do this procedure correctly so that you can catch problems early, when treatment is more likely to be successful. All women should practice breast self-awareness, including women who have hadbreast implants. What you need: A mirror. A well-lit room. How to do a breast self-exam A breast self-exam is one way to learn what is normal for your breasts andwhether your breasts are changing. To do a breast self-exam: Look for changes  Remove all the clothing above your waist. Stand in front of a mirror in a room with good lighting. Put your hands on your hips. Push your hands firmly downward. Compare your breasts in the mirror. Look for differences between them (asymmetry), such as: Differences in shape. Differences in size. Puckers, dips, and bumps in one breast and not the other. Look at each breast for changes in the skin, such as: Redness. Scaly areas. Look for changes in your nipples, such as: Discharge. Bleeding. Dimpling. Redness. A change in position.  Feel for changes Carefully feel your breasts for lumps and changes. It is best to do this while lying on your back   lying on your back on the floor, and again while sitting or standing in the tub or shower with soapy water on your skin. Feel each breast in the following way: Place the arm on the side of the breast you are examining above your head. Feel your breast with the other hand. Start in the nipple area and make -inch (2 cm) overlapping circles to feel your breast. Use the pads of your three middle fingers to do this. Apply light pressure, then medium pressure, then firm pressure. The light pressure will allow you to feel the tissue closest to the skin. The medium pressure will allow  you to feel the tissue that is a little deeper. The firm pressure will allow you to feel the tissue close to the ribs. Continue the overlapping circles, moving downward over the breast until you feel your ribs below your breast. Move one finger-width toward the center of the body. Continue to use the -inch (2 cm) overlapping circles to feel your breast as you move slowly up toward your collarbone. Continue the up-and-down exam using all three pressures until you reach your armpit.  Write down what you find Writing down what you find can help you remember what to discuss with your health care provider. Write down: What is normal for each breast. Any changes that you find in each breast, including: The kind of changes you find. Any pain or tenderness. Size and location of any lumps. Where you are in your menstrual cycle, if you are still menstruating. General tips and recommendations Examine your breasts every month. If you are breastfeeding, the best time to examine your breasts is after a feeding or after using a breast pump. If you menstruate, the best time to examine your breasts is 5-7 days after your period. Breasts are generally lumpier during menstrual periods, and it may be more difficult to notice changes. With time and practice, you will become more familiar with the variations in your breasts and more comfortable with the exam. Contact a health care provider if you: See a change in the shape or size of your breasts or nipples. See a change in the skin of your breast or nipples, such as a reddened or scaly area. Have unusual discharge from your nipples. Find a lump or thick area that was not there before. Have pain in your breasts. Have any concerns related to your breast health. Summary Breast self-awareness includes looking for physical changes in your breasts, as well as feeling for any changes within your breasts. Breast self-awareness should be performed in front of a mirror  in a well-lit room. You should examine your breasts every month. If you menstruate, the best time to examine your breasts is 5-7 days after your menstrual period. Let your health care provider know of any changes you notice in your breasts, including changes in size, changes on the skin, pain or tenderness, or unusual fluid from your nipples. This information is not intended to replace advice given to you by your health care provider. Make sure you discuss any questions you have with your healthcare provider. Document Revised: 07/25/2018 Document Reviewed: 07/25/2018 Elsevier Patient Education  2022 [place].       Preventing Cervical Cancer Cervical cancer is cancer that grows on the cervix. The cervix is at the bottom of the uterus. It connects the uterus to the vagina. The uterus is where a babydevelops during pregnancy. Cancer occurs when cells become abnormal and start to grow out of control. If  cervical cancer is not found early, it can spread and become dangerous. Cervical cancer cannot always be prevented, but you can take steps to loweryour risk of developing this condition. How can this condition affect me? Cervical cancer grows slowly and may not cause any symptoms at first. Over time, the cancer can grow deep into the cervix tissue and spread to otherareas. This may take years, and it may happen without you knowing about it. If it is found early, cervical cancer can be treated effectively. If the cancer has grown deep into your cervix or has spread, it will be more difficult totreat. Most cases of cervical cancer are caused by an STI (sexually transmitted infection) called human papillomavirus (HPV). One way to reduce your risk of cervical cancer is to take steps to avoid infection with the HPV virus. Getting regular Pap tests is also important because this can help identify changes in cells that could lead to cancer. Your chances of getting this disease can also bereduced by  making certain lifestyle changes. What can increase my risk? You are more likely to develop this condition if: You have certain things in your sexual history, such as: Having a sexually transmitted viral infection. These include chlamydia and herpes. Having more than one sexual partner, or having sex with someone who has more than one sexual partner. Not using condoms during sex. Having been sexually active before the age of 35. Your mother took a medicine called diethylstilbestrol (DES) while pregnant with you, causing you to be exposed to this medicine before birth. Your mother or sister has had cervical cancer. You are between the ages of 58-50. You have or have had certain other medical conditions, such as: Previous cancer of the vagina or vulva. A weakened body defense system (immune system). A history of dysplasia of the cervix. You use oral contraceptives, also called birth control pills. You smoke or breathe in secondhand smoke. What actions can I take to prevent cervical cancer? Preventing HPV infection  Ask your health care provider about getting the HPV vaccine. If you are 66 years old or younger, you may need to get this vaccine, which is given in three doses over 6 months. This vaccine protects against the types of HPV that could cause cancer. Limit the number of people you have sex with. Also avoid having sex with people who have had many sex partners. Use a latex condom every time you have sex.  Getting Pap tests Get Pap tests regularly, starting at age 21. Talk with your health care provider about how often you need these tests. Having regular Pap tests will help identify changes in cells that could lead to cancer. Steps can then be taken to prevent cancer from developing. Most women who are 3?40 years of age should have a Pap test every 3 years. Most women who are 65?40 years of age should have a Pap test in combination with an HPV test every 5 years. Women with a higher  risk of cervical cancer, such as those with a weakened immune system or those who were exposed to DES medicine before birth, may need more frequent testing. Making other lifestyle changes  Do not use any products that contain nicotine or tobacco, such as cigarettes, e-cigarettes, and chewing tobacco. If you need help quitting, ask your health care provider. Eat a healthy diet that includes at least 5 servings of fruits and vegetables every day. Lose weight if you are overweight.  Where to find support Talk with your  health care provider, school nurse, or local health departmentfor guidance about screening and vaccination. Some children and teens may be able to get the HPV vaccine free of charge through the U.S. government's Vaccines for Children [facility]) program. Other places that provide vaccinations include: Public health clinics. Check with your local health department. [place], where you would pay only what you can afford. To find one near you, check this website: http://lyons.com/ [place]. These are part of a program for Medicare and Medicaid patients who live in rural areas. The National Breast and Cervical Cancer Early Detection Program also provides breast and cervical cancer screenings and diagnostic services to low-income,uninsured, and underinsured women. Cervical cancer can be passed down through families. Talk with your health careprovider or a genetic counselor to learn more about genetic testing for cancer. Where to find more information Learn more about cervical cancer from: [employer] of Gynecology: www.acog.org American Cancer Society: www.cancer.org Centers for Disease Control and Prevention: http://www.wolf.info/ Contact a health care provider if you have: Pelvic pain. Unusual discharge or bleeding from your vagina. Summary Cervical cancer is cancer that grows on the cervix. The cervix is at the bottom of the uterus. Ask your  health care provider about getting the HPV vaccine. Be sure to get regular Pap tests as recommended by your health care provider. See your health care provider right away if you have any pelvic pain or unusual discharge or bleeding from your vagina. This information is not intended to replace advice given to you by your health care provider. Make sure you discuss any questions you have with your healthcare provider. Document Revised: 07/09/2019 Document Reviewed: 07/09/2019 Elsevier Patient Education  2022 [place] [phone]) [place] [address] [place], [place] [address] [phone] Mon-Fri 8:30-12:30, 1:30-5:00 Accepting [facility] at [place], [place], [place] [address] [phone] Mon-Fri 8:00-5:30 [place] [facility] [address] [place], [place] [address] [phone] Mechele Dawley, [place], Fri 8:30-5:00, Wed 10:00-7:00 (closed 1-2pm) Accepting [facility] [place]. [address], [place], [place]  [address] Phone - [phone]   Fax - [phone]  [place] [phone]) [facility] [address] [place], [place] [address] [phone] Mon-Fri 8:00-5:00 [facility] Pediatrics at [facility])  [address] Barbara Cower [place], [place] [address] [phone] Mon-Fri 8:30-5:30, Sat (Oct.-Mar.) 9:00-1:00 Accepting Medicaid  [place] [phone]) [place] at [place], [place], [place] [address] [phone] Mon-Fri 8:00-5:00  [place] [phone]) [place] at [facility] [address] [place], [place] [address] [phone] Mon-Fri 8:00-5:00 [place] at [place], [place], [place] [address] [phone] Mon-Fri 8:00-5:00 [place] at [facility] [address] Madelaine Bhat [place], [place] [address] [phone] Mon-Fri 8:00-5:00 [place] [place] [address] [phone] Mon-Fri 7:30-5:30  [place] [phone] & [phone]) [facility] [address] [place], [place] [address] [phone] Mon-Thur 8:00-6:00 Accepting Medicaid [place] [address] Madelaine Bhat [place], [place] [address] [phone] Mon-Thur 7:30-7:30, Fri 7:30-4:30 Accepting [facility] Family Medicine at [place]. [address] [place], [place]  [address] [phone]   Fax - [place] [phone] & [phone]) [place] at [place] [place], [place] [address] [phone] Mon-Fri 7:00-5:00 [place]  [place] [address], [place], [place] [address] [phone] Mon-Fri 8:00-5:00 Accepting Medicaid [place] [address] [place], [place], [place] [address] [phone] Mon-Fri 8:00-5:00 Accepting Medicaid  [place] [phone]) [facility] Primary Care at [facility] [address] Madelaine Bhat [place], [place] [address] [phone] Mon-Fri 8:00-5:00 [place] ([place] at [facility]) [address] [place], [place], [place] [address] [phone] Mon-Fri 8:00-5:00 Accepting Medicaid [place] ([facility] [employer]) [address] [place], [place], [place] [address] [phone] Mon-Fri 8:00-5:30, Sat&Sun by appointment (phones open at 8:30) Accepting [facility] [phone] & [phone]) [place] [address] [place], [place] [address] [phone] Mon-Thur 8:00-7:00, Fri 8:00-5:00, Sat 8:00-12:00, Sun 9:00-12:00 Accepting Medicaid [facility] -  Family Medicine at [facility] [address] [place], [place], [place] [address] [phone] Mon-Thur 8:00-5:00 Accepting Medicaid [facility] Family Medicine at [place]., [place], [place] [address] [phone] Mon-Fri 8:00-5:30, Sat (Oct.-Mar.) 9:00-1:00 Accepting [facility]  [place] [phone]) [place] [address] [place], [place], [place] [address] [phone] Mon-Fri 8:00-5:00 Accepting [facility]   [place] [phone]) [place] at [facility] [address], [place], [place] [address] [phone] Mon-Fri 8:00-5:00 [place] [facility] at [facility] [address] Marolyn Hammock [place], [place] [address] [phone] Mon-Fri 8:00-5:00 [place], [place], [place] [address] [phone] Mon-Fri 8:00-5:00 After hours clinic [facility] [place], [place] [address]) [phone] Mon-Fri 5:00-8:00, Sat 12:00-6:00, Sun 10:00-4:00 Accepting Medicaid [place] at [facility]. [address] [place], [place]  [address] [phone]   Fax - [phone]  [place] [phone]) [place] at [facility] [address] [place], [place], [place] [address] [phone] Mon-Fri 8:00-5:00 [place] ([place] at [place]) [address], [place], [place] [address] [phone] Mon-Thur 8:00-7:00, Fri 8:00-5:00, Sat 8:00-12:00

## 2021-07-15 NOTE — Progress Notes (Signed)
Pt states she is on continuous pills for bleeding control, well controlled.   Pt states she has always had heavy, prolong bleeding.  Pt had mammo in 2020.

## 2021-07-16 LAB — HEPATITIS B SURFACE ANTIGEN: Hepatitis B Surface Ag: NEGATIVE

## 2021-07-16 LAB — RPR: RPR Ser Ql: NONREACTIVE

## 2021-07-16 LAB — HIV ANTIBODY (ROUTINE TESTING W REFLEX): HIV Screen 4th Generation wRfx: NONREACTIVE

## 2021-07-16 LAB — HEPATITIS C ANTIBODY: Hep C Virus Ab: 0.1 s/co ratio (ref 0.0–0.9)

## 2021-07-17 LAB — CYTOLOGY - PAP
Chlamydia: NEGATIVE
Comment: NEGATIVE
Comment: NEGATIVE
Comment: NEGATIVE
Comment: NORMAL
Diagnosis: NEGATIVE
High risk HPV: NEGATIVE
Neisseria Gonorrhea: NEGATIVE
Trichomonas: NEGATIVE

## 2021-07-20 ENCOUNTER — Other Ambulatory Visit: Payer: Self-pay | Admitting: Women's Health

## 2021-07-20 DIAGNOSIS — Z3009 Encounter for other general counseling and advice on contraception: Secondary | ICD-10-CM

## 2021-07-20 MED ORDER — SLYND 4 MG PO TABS: 1.0000 | ORAL_TABLET | Freq: Every day | ORAL | 11 refills | Status: DC

## 2021-07-20 NOTE — Progress Notes (Signed)
RX [place].  Jacaria Colburn, Gerrie Nordmann, NP  4:00 AM 07/20/2021

## 2021-07-21 ENCOUNTER — Ambulatory Visit (HOSPITAL_BASED_OUTPATIENT_CLINIC_OR_DEPARTMENT_OTHER)
Admission: RE | Admit: 2021-07-21 | Discharge: 2021-07-21 | Disposition: A | Discharge status: Home / Self Care | Payer: Medicaid Other | Source: Ambulatory Visit | Attending: Women's Health | Admitting: Women's Health

## 2021-07-21 ENCOUNTER — Other Ambulatory Visit: Payer: Self-pay | Admitting: Women's Health

## 2021-07-21 ENCOUNTER — Other Ambulatory Visit: Payer: Self-pay

## 2021-07-21 DIAGNOSIS — Z1231 Encounter for screening mammogram for malignant neoplasm of breast: Secondary | ICD-10-CM

## 2021-07-21 DIAGNOSIS — R1906 Epigastric swelling, mass or lump: Secondary | ICD-10-CM | POA: Diagnosis present

## 2021-08-10 ENCOUNTER — Other Ambulatory Visit: Payer: Self-pay

## 2021-08-10 ENCOUNTER — Ambulatory Visit: Payer: Medicaid Other | Admitting: Physician Assistant

## 2021-08-10 ENCOUNTER — Encounter: Payer: Self-pay | Admitting: Physician Assistant

## 2021-08-10 VITALS — Ht 68.0 in | Wt 316.0 lb

## 2021-08-10 DIAGNOSIS — G8929 Other chronic pain: Secondary | ICD-10-CM

## 2021-08-10 DIAGNOSIS — M25562 Pain in left knee: Secondary | ICD-10-CM

## 2021-08-10 MED ORDER — LIDOCAINE HCL 1 % IJ SOLN
3.0000 mL | INTRAMUSCULAR | Status: AC | PRN
Start: 2021-08-10 — End: 2021-08-10
  Administered 2021-08-10: 3 mL

## 2021-08-10 MED ORDER — METHYLPREDNISOLONE ACETATE 40 MG/ML IJ SUSP
40.0000 mg | INTRAMUSCULAR | Status: AC | PRN
  Administered 2021-08-10: 40 mg via INTRA_ARTICULAR

## 2021-08-10 NOTE — Progress Notes (Signed)
   Procedure Note  Patient: Jaime Riggs             Date of Birth: 12-13-1981           MRN: [id number]             Visit Date: 08/10/2021 HPI: Jaime Riggs returns today due to left knee pain.  She is requesting cortisone injection in her left knee.  She was supposed to come in in June for an injection but she ended up contracting COVID.  Her pain in her knees became worse since then no injury.  She has developed migraines and hypertension since COVID.  However she does have good control of her diabetes last hemoglobin A1c was in the fives.  Previous radiographs of her left knee have shown medial joint line narrowing mild to moderate.  Also mild patellofemoral changes.  Review of systems: See HPI otherwise negative  Physical exam: General: Well-developed well-nourished female no acute distress mood and affect appropriate Left knee no abnormal warmth erythema.  She has slight effusion of the knee.  Overall good range of motion of the knee.  Procedures: Visit Diagnoses:  1. Chronic pain of left knee     Large Joint Inj: L knee on 08/10/2021 5:19 PM Details: superolateral approach Medications: 3 mL lidocaine 1 %; 40 mg methylPREDNISolone acetate 40 MG/ML Aspirate: 10 mL yellow Consent was given by the parent. Immediately prior to procedure a time out was called to verify the correct patient, procedure, equipment, support staff and site/side marked as required. Patient was prepped and draped in the usual sterile fashion.    Plan: She understands to wait at least 3 months between injections.  She will work on Forensic scientist.  Questions encouraged and answered at length.

## 2022-02-13 ENCOUNTER — Emergency Department (HOSPITAL_BASED_OUTPATIENT_CLINIC_OR_DEPARTMENT_OTHER)
Admission: EM | Admit: 2022-02-13 | Discharge: 2022-02-13 | Disposition: A | Discharge status: Home / Self Care | Payer: Medicaid Other | Attending: Emergency Medicine | Admitting: Emergency Medicine

## 2022-02-13 ENCOUNTER — Other Ambulatory Visit: Payer: Self-pay

## 2022-02-13 ENCOUNTER — Encounter (HOSPITAL_BASED_OUTPATIENT_CLINIC_OR_DEPARTMENT_OTHER): Payer: Self-pay

## 2022-02-13 ENCOUNTER — Emergency Department (HOSPITAL_BASED_OUTPATIENT_CLINIC_OR_DEPARTMENT_OTHER): Payer: Medicaid Other

## 2022-02-13 ENCOUNTER — Emergency Department (HOSPITAL_BASED_OUTPATIENT_CLINIC_OR_DEPARTMENT_OTHER): Payer: Medicaid Other | Admitting: Radiology

## 2022-02-13 DIAGNOSIS — Z79899 Other long term (current) drug therapy: Secondary | ICD-10-CM | POA: Insufficient documentation

## 2022-02-13 DIAGNOSIS — R630 Anorexia: Secondary | ICD-10-CM | POA: Insufficient documentation

## 2022-02-13 DIAGNOSIS — Z7984 Long term (current) use of oral hypoglycemic drugs: Secondary | ICD-10-CM | POA: Insufficient documentation

## 2022-02-13 DIAGNOSIS — I10 Essential (primary) hypertension: Secondary | ICD-10-CM | POA: Diagnosis not present

## 2022-02-13 DIAGNOSIS — R1013 Epigastric pain: Secondary | ICD-10-CM | POA: Diagnosis present

## 2022-02-13 DIAGNOSIS — R0789 Other chest pain: Secondary | ICD-10-CM | POA: Insufficient documentation

## 2022-02-13 DIAGNOSIS — M545 Low back pain, unspecified: Secondary | ICD-10-CM | POA: Insufficient documentation

## 2022-02-13 DIAGNOSIS — E119 Type 2 diabetes mellitus without complications: Secondary | ICD-10-CM | POA: Diagnosis not present

## 2022-02-13 LAB — BASIC METABOLIC PANEL
Anion gap: 8 (ref 5–15)
BUN: 9 mg/dL (ref 6–20)
CO2: 27 mmol/L (ref 22–32)
Calcium: 9.2 mg/dL (ref 8.9–10.3)
Chloride: 102 mmol/L (ref 98–111)
Creatinine, Ser: 0.75 mg/dL (ref 0.44–1.00)
GFR, Estimated: >60 mL/min (ref >60)
Glucose, Bld: 77 mg/dL (ref 70–99)
Potassium: 4.5 mmol/L (ref 3.5–5.1)
Sodium: 137 mmol/L (ref 135–145)

## 2022-02-13 LAB — HEPATIC FUNCTION PANEL
ALT: 12 U/L (ref 0–44)
AST: 22 U/L (ref 15–41)
Albumin: 4 g/dL (ref 3.5–5.0)
Alkaline Phosphatase: 89 U/L (ref 38–126)
Bilirubin, Direct: <0.1 mg/dL (ref 0.0–0.2)
Total Bilirubin: 0.5 mg/dL (ref 0.3–1.2)
Total Protein: 7.7 g/dL (ref 6.5–8.1)

## 2022-02-13 LAB — TROPONIN I (HIGH SENSITIVITY)
Troponin I (High Sensitivity): <2 ng/L (ref <18)
Troponin I (High Sensitivity): <2 ng/L (ref <18)

## 2022-02-13 LAB — CBC
HCT: 38.2 % (ref 36.0–46.0)
Hemoglobin: 11.9 g/dL — ABNORMAL LOW (ref 12.0–15.0)
MCH: 23.9 pg — ABNORMAL LOW (ref 26.0–34.0)
MCHC: 31.2 g/dL (ref 30.0–36.0)
MCV: 76.7 fL — ABNORMAL LOW (ref 80.0–100.0)
Platelets: 247 10*3/uL (ref 150–400)
RBC: 4.98 MIL/uL (ref 3.87–5.11)
RDW: 16.9 % — ABNORMAL HIGH (ref 11.5–15.5)
WBC: 11.9 10*3/uL — ABNORMAL HIGH (ref 4.0–10.5)
nRBC: 0 % (ref 0.0–0.2)

## 2022-02-13 LAB — LIPASE, BLOOD: Lipase: 19 U/L (ref 11–51)

## 2022-02-13 LAB — D-DIMER, QUANTITATIVE: D-Dimer, Quant: 0.45 ug/mL-FEU (ref 0.00–0.50)

## 2022-02-13 MED ORDER — LIDOCAINE VISCOUS HCL 2 % MT SOLN
15.0000 mL | Freq: Once | OROMUCOSAL | Status: AC
Start: 2022-02-13 — End: 2022-02-13
  Administered 2022-02-13: 15 mL via ORAL
  Filled 2022-02-13: qty 15

## 2022-02-13 MED ORDER — ALUM & MAG HYDROXIDE-SIMETH 200-200-20 MG/5ML PO SUSP
30.0000 mL | Freq: Once | ORAL | Status: AC
Start: 2022-02-13 — End: 2022-02-13
  Administered 2022-02-13: 30 mL via ORAL
  Filled 2022-02-13: qty 30

## 2022-02-13 MED ORDER — OXYCODONE-ACETAMINOPHEN 5-325 MG PO TABS: 1.0000 | ORAL_TABLET | Freq: Four times a day (QID) | ORAL | 0 refills | Status: AC | PRN

## 2022-02-13 MED ORDER — SUCRALFATE 1 G PO TABS: 1.0000 g | ORAL_TABLET | Freq: Three times a day (TID) | ORAL | 0 refills | Status: AC

## 2022-02-13 MED ORDER — DICYCLOMINE HCL 10 MG PO CAPS
10.0000 mg | ORAL_CAPSULE | Freq: Once | ORAL | Status: AC
Start: 2022-02-13 — End: 2022-02-13
  Administered 2022-02-13: 10 mg via ORAL
  Filled 2022-02-13: qty 1

## 2022-02-13 MED ORDER — SUCRALFATE 1 G PO TABS
1.0000 g | ORAL_TABLET | Freq: Once | ORAL | Status: DC
  Filled 2022-02-13: qty 1

## 2022-02-13 MED ORDER — SUCRALFATE 1 GM/10ML PO SUSP
1.0000 g | Freq: Three times a day (TID) | ORAL | Status: DC
  Administered 2022-02-13: 1 g via ORAL
  Filled 2022-02-13: qty 10

## 2022-02-13 MED ORDER — PANTOPRAZOLE SODIUM 20 MG PO TBEC: 40.0000 mg | DELAYED_RELEASE_TABLET | Freq: Every day | ORAL | 0 refills | Status: AC

## 2022-02-13 MED ORDER — IOHEXOL 350 MG/ML SOLN
100.0000 mL | Freq: Once | INTRAVENOUS | Status: AC | PRN
  Administered 2022-02-13: 100 mL via INTRAVENOUS

## 2022-02-13 MED ORDER — SUCRALFATE 1 G PO TABS: 1.0000 g | ORAL_TABLET | Freq: Once | ORAL | Status: DC

## 2022-02-13 MED ORDER — FAMOTIDINE 40 MG PO TABS: 40.0000 mg | ORAL_TABLET | Freq: Every day | ORAL | 0 refills | Status: AC

## 2022-02-13 NOTE — ED Triage Notes (Signed)
Pt arrives POV w/ c/o of shortness of breath beginning last night.  States it hurts to take a deep breath.  Describes pain in epigastric area.  Denies any nausea, vomiting, dizziness.  Pt appears uncomfortable in triage.

## 2022-02-13 NOTE — Discharge Instructions (Addendum)
I think that you may have a peptic ulcer in your stomach. I want you to follow up with GI. Please call them on Monday to schedule an appointment. Dr. Leanna Sato information is in your discharge paperwork.  I have prescribed you four medications - Take Pepcid and Omeprazole daily. These medications help coat the lining of your stomach so it will repair - Take Carafate as needed for the pain. Since you are having pain before you eat, please take this prior to eating - I have prescribed you Percocet for breakthrough pain. Please take this if your pain is not getting better by other measures. Do not take this prior to driving as it can make you sleepy  Avoid spicy foods, foods high in acid such as citrus or tomato based foods, alcohol, and carbonated drinks.  Do not take Naproxen or Ibuprofen during this time as this can make your symptoms worse.  If you are not able to start tolerating food and water by Monday, you should return to the ED. If your symptoms worsen before then, you should return to the ED.

## 2022-02-13 NOTE — ED Notes (Signed)
Pt was unable to take deep breaths without coughing. Pt stated she only has cough with deep breaths and that it also hurts. Pt describes pain with breathing in as sharp and a 10 out of 10. Pt describes pain as dull ach at rest but a 5 out of 5 at rest.

## 2022-02-13 NOTE — ED Provider Notes (Signed)
[facility] EMERGENCY DEPT Provider Note   CSN: [id number] Arrival date & time: 02/13/22  1538     History  Chief Complaint  Patient presents with   Shortness of Breath    Jaime Riggs is a 41 y.o. female.  He has a past medical history of hypertension, diabetes, morbid obesity status post gastric sleeve in 2021.  Patient presents emergency department with a chief complaint of epigastric chest pain.  Pain started last night and has been localized to the center of her chest at the top of her abdomen.  She says that the pain is constant but it hurts worse when she takes deep breaths.  She also has associated back pain radiation when she takes deep breaths.  She feels like she has been having increased shortness of breath with exertion as well.  She denies any nausea or vomiting, however has had a decreased appetite because every time she eats she has the sensation that PO is getting stuck in her stomach.  This is never happened before.  Patient also notes a history of a hiatal hernia.   Shortness of Breath Associated symptoms: abdominal pain and chest pain   Associated symptoms: no cough, no fever and no vomiting       Home Medications Prior to Admission medications   Medication Sig Start Date End Date Taking? Authorizing Provider  famotidine (PEPCID) 40 MG tablet Take 1 tablet (40 mg total) by mouth daily. 02/13/22 03/15/22 Yes Patterson Hollenbaugh, Adora Fridge, PA-C  oxyCODONE-acetaminophen (PERCOCET/ROXICET) 5-325 MG tablet Take 1 tablet by mouth every 6 (six) hours as needed for severe pain. 02/13/22  Yes Elyjah Hazan, Adora Fridge, PA-C  pantoprazole (PROTONIX) 20 MG tablet Take 2 tablets (40 mg total) by mouth daily. 02/13/22 03/15/22 Yes Miliyah Luper, Adora Fridge, PA-C  sucralfate (CARAFATE) 1 g tablet Take 1 tablet (1 g total) by mouth 4 (four) times daily -  with meals and at bedtime. 02/13/22 03/15/22 Yes Mihran Lebarron, Adora Fridge, PA-C  acetaminophen (TYLENOL) 325 MG tablet Take 650 mg by mouth every 6  (six) hours as needed.    [provider]  albuterol (VENTOLIN HFA) 108 (90 Base) MCG/ACT inhaler Inhale 2 puffs into the lungs every 6 (six) hours as needed for wheezing or shortness of breath.  06/01/19   [provider]  ALPRAZolam Duanne Moron) 0.5 MG tablet Take 0.25-0.5 mg by mouth 2 (two) times daily as needed for anxiety.  07/27/19   [provider]  cetirizine (ZYRTEC) 10 MG tablet Take 10 mg by mouth at bedtime.     [provider]  cyclobenzaprine (FLEXERIL) 5 MG tablet Take 1 tablet (5 mg total) by mouth at bedtime as needed for muscle spasms. Patient taking differently: Take 5 mg by mouth at bedtime. 05/30/18   Tasia Catchings, Amy V, PA-C  Drospirenone (SLYND) 4 MG TABS Take 1 tablet by mouth daily. 07/20/21   Nugent, Gerrie Nordmann, NP  FEROSUL 325 (65 Fe) MG tablet Take 325 mg by mouth daily. 10/10/19   [provider]  hydrochlorothiazide (MICROZIDE) 12.5 MG capsule Take 12.5 mg by mouth daily. 07/16/21   [provider]  ibuprofen (ADVIL) 800 MG tablet Take 800 mg by mouth 3 (three) times daily as needed. 05/10/21   [provider]  lidocaine (LIDODERM) 5 % SMARTSIG:Patch(s) Topical Daily PRN 07/20/21   [provider]  metFORMIN (GLUCOPHAGE) 500 MG tablet Take 500 mg by mouth 2 (two) times daily with a meal.  08/24/19   [provider]  Multiple Vitamins-Minerals (MULTIVITAMIN WITH MINERALS) tablet Take 1 tablet by mouth daily.    [provider]  nabumetone (RELAFEN) 750 MG tablet Take 1 tablet (750 mg total) by mouth 2 (two) times daily as needed. 04/06/21   Mcarthur Rossetti, MD  sertraline (ZOLOFT) 100 MG tablet Take 200 mg by mouth at bedtime. 11/13/19   [provider]      Allergies    Patient has no known allergies.    Review of Systems   Review of Systems  Constitutional:  Negative for fever.  Respiratory:  Positive for shortness of breath. Negative for cough.   Cardiovascular:  Positive for chest  pain. Negative for leg swelling.  Gastrointestinal:  Positive for abdominal pain. Negative for nausea and vomiting.  Musculoskeletal:  Positive for back pain.  All other systems reviewed and are negative.  Physical Exam Updated Vital Signs BP (!) 133/91    Pulse 92    Temp 98.9 F (37.2 C) (Oral)    Resp 16    Ht 5\' 8"  (1.727 m)    Wt (!) 145.2 kg    SpO2 99%    BMI 48.66 kg/m  Physical Exam Vitals and nursing note reviewed.  Constitutional:      General: She is not in acute distress.    Appearance: Normal appearance. She is obese. She is not ill-appearing, toxic-appearing or diaphoretic.  HENT:     Head: Normocephalic and atraumatic.     Nose: No nasal deformity.     Mouth/Throat:     Lips: Pink. No lesions.     Mouth: Mucous membranes are moist. No injury, lacerations, oral lesions or angioedema.     Pharynx: Oropharynx is clear. Uvula midline. No pharyngeal swelling, oropharyngeal exudate, posterior oropharyngeal erythema or uvula swelling.  Eyes:     General: Gaze aligned appropriately. No scleral icterus.       Right eye: No discharge.        Left eye: No discharge.     Conjunctiva/sclera: Conjunctivae normal.     Right eye: Right conjunctiva is not injected. No exudate or hemorrhage.    Left eye: Left conjunctiva is not injected. No exudate or hemorrhage.    Pupils: Pupils are equal, round, and reactive to light.  Cardiovascular:     Rate and Rhythm: Normal rate and regular rhythm.     Pulses: Normal pulses.          Radial pulses are 2+ on the right side and 2+ on the left side.       Dorsalis pedis pulses are 2+ on the right side and 2+ on the left side.     Heart sounds: Normal heart sounds, S1 normal and S2 normal. Heart sounds not distant. No murmur heard.   No friction rub. No gallop. No S3 or S4 sounds.  Pulmonary:     Effort: Pulmonary effort is normal. No accessory muscle usage or respiratory distress.     Breath sounds: Normal breath sounds. No stridor. No  wheezing, rhonchi or rales.     Comments: Patient with significant epigastric discomfort when taking deep breaths.  Chest:     Chest wall: No tenderness.  Abdominal:     General: Abdomen is flat. There is no distension.     Palpations: Abdomen is soft. There is no mass or pulsatile mass.     Tenderness: There is abdominal tenderness. There is no guarding or rebound.     Comments: Significantly tender to epigastric region right  beneath sternum. Voluntary guarding present. Abdomen is soft and nontender in other quadrants.   Musculoskeletal:     Right lower leg: No edema.     Left lower leg: No edema.  Skin:    General: Skin is warm and dry.     Coloration: Skin is not jaundiced or pale.     Findings: No bruising, erythema, lesion or rash.  Neurological:     General: No focal deficit present.     Mental Status: She is alert and oriented to person, place, and time.     GCS: GCS eye subscore is 4. GCS verbal subscore is 5. GCS motor subscore is 6.  Psychiatric:        Mood and Affect: Mood normal.        Behavior: Behavior normal. Behavior is cooperative.    ED Results / Procedures / Treatments   Labs (all labs ordered are listed, but only abnormal results are displayed) Labs Reviewed  CBC - Abnormal; Notable for the following components:      Result Value   WBC 11.9 (*)    Hemoglobin 11.9 (*)    MCV 76.7 (*)    MCH 23.9 (*)    RDW 16.9 (*)    All other components within normal limits  BASIC METABOLIC PANEL  HEPATIC FUNCTION PANEL  LIPASE, BLOOD  D-DIMER, QUANTITATIVE  PREGNANCY, URINE  TROPONIN I (HIGH SENSITIVITY)  TROPONIN I (HIGH SENSITIVITY)    EKG EKG Interpretation  Date/Time:  Saturday February 13 2022 15:50:42 EST Ventricular Rate:  95 PR Interval:  180 QRS Duration: 94 QT Interval:  362 QTC Calculation: 454 R Axis:   -9 Text Interpretation: Normal sinus rhythm Normal ECG When compared with ECG of 02-May-2014 12:52, PREVIOUS ECG IS PRESENT since last  tracing no significant change Confirmed by Malvin Johns [phone]) on 02/13/2022 7:02:23 PM  Radiology DG Chest 2 View  Result Date: 02/13/2022 CLINICAL DATA:  Upper quadrant pain with breathing. EXAM: CHEST - 2 VIEW COMPARISON:  05/02/2014. FINDINGS: Normal heart, mediastinum and hila. Clear lungs.  No pleural effusion or pneumothorax. Skeletal structures are unremarkable. IMPRESSION: No active cardiopulmonary disease. Electronically Signed   By: Lajean Manes M.D.   On: 02/13/2022 16:41   CT Angio Chest/Abd/Pel for Dissection W and/or W/WO  Result Date: 02/13/2022 CLINICAL DATA:  Acute aortic syndrome suspected. EXAM: CT ANGIOGRAPHY CHEST, ABDOMEN AND PELVIS TECHNIQUE: Non-contrast CT of the chest was initially obtained. Multidetector CT imaging through the chest, abdomen and pelvis was performed using the standard protocol during bolus administration of intravenous contrast. Multiplanar reconstructed images and MIPs were obtained and reviewed to evaluate the vascular anatomy. RADIATION DOSE REDUCTION: This exam was performed according to the departmental dose-optimization program which includes automated exposure control, adjustment of the mA and/or kV according to patient size and/or use of iterative reconstruction technique. CONTRAST:  181mL OMNIPAQUE IOHEXOL 350 MG/ML SOLN COMPARISON:  Chest radiograph dated 02/13/2022. FINDINGS: CTA CHEST FINDINGS Cardiovascular: There is no cardiomegaly or pericardial effusion. The thoracic aorta is unremarkable. The origins of the great vessels of the aortic arch and central pulmonary arteries appear patent. Mediastinum/Nodes: No hilar or mediastinal adenopathy. The esophagus is grossly unremarkable. There is a 3.3 cm left thyroid nodule. Recommend thyroid US (ref: J Am Coll Radiol. 2015 Feb;12(2): 143-50).No mediastinal fluid collection. Lungs/Pleura: Bibasilar linear atelectasis. Probable trace right pleural effusion. No focal consolidation or pneumothorax. The  central airways are patent. Musculoskeletal: Mild degenerative changes. No acute osseous pathology. Review of the MIP  images confirms the above findings. CTA ABDOMEN AND PELVIS FINDINGS VASCULAR Aorta: Normal caliber aorta without aneurysm, dissection, vasculitis or significant stenosis. Celiac: Patent without evidence of aneurysm, dissection, vasculitis or significant stenosis. SMA: Patent without evidence of aneurysm, dissection, vasculitis or significant stenosis. Renals: Both renal arteries are patent without evidence of aneurysm, dissection, vasculitis, fibromuscular dysplasia or significant stenosis. IMA: Patent without evidence of aneurysm, dissection, vasculitis or significant stenosis. Inflow: Patent without evidence of aneurysm, dissection, vasculitis or significant stenosis. Veins: No obvious venous abnormality within the limitations of this arterial phase study. Review of the MIP images confirms the above findings. NON-VASCULAR No intra-abdominal free air or free fluid. Hepatobiliary: Fatty liver. No intrahepatic biliary ductal dilatation. The gallbladder is unremarkable. Pancreas: Unremarkable. No pancreatic ductal dilatation or surrounding inflammatory changes. Spleen: Normal in size without focal abnormality. Adrenals/Urinary Tract: Adrenal glands are unremarkable. Kidneys are normal, without renal calculi, focal lesion, or hydronephrosis. Bladder is unremarkable. Stomach/Bowel: Postsurgical changes of gastric sleeve. There is no bowel obstruction or active inflammation. The appendix is not visualized with certainty. No inflammatory changes identified in the right lower quadrant. Lymphatic: No adenopathy. Reproductive: The uterus is retroflexed.  No adnexal masses. Other: Small fat containing supraumbilical hernia. Musculoskeletal: No acute or significant osseous findings. Review of the MIP images confirms the above findings. IMPRESSION: 1. No acute intrathoracic, abdominal, or pelvic pathology. No  aortic aneurysm or dissection. 2. A 3.3 cm left thyroid nodule. Further evaluation with ultrasound on a nonemergent/outpatient basis recommended. Electronically Signed   By: Anner Crete M.D.   On: 02/13/2022 19:08    Procedures Procedures  On tele  Medications Ordered in ED Medications  sucralfate (CARAFATE) 1 GM/10ML suspension 1 g (1 g Oral Given 02/13/22 2053)  dicyclomine (BENTYL) capsule 10 mg (10 mg Oral Given 02/13/22 1737)  alum & mag hydroxide-simeth (MAALOX/MYLANTA) 200-200-20 MG/5ML suspension 30 mL (30 mLs Oral Given 02/13/22 1740)    And  lidocaine (XYLOCAINE) 2 % viscous mouth solution 15 mL (15 mLs Oral Given 02/13/22 1739)  iohexol (OMNIPAQUE) 350 MG/ML injection 100 mL (100 mLs Intravenous Contrast Given 02/13/22 1825)    ED Course/ Medical Decision Making/ A&P                           Medical Decision Making Amount and/or Complexity of Data Reviewed Labs: ordered. Radiology: ordered.  Risk OTC drugs. Prescription drug management.   This is a 41 y.o. female with a pertinent PMH of hypertension, diabetes, morbid obesity status post gastric sleeve in 2021 who presents to the ED with epigastric abdominal pain that radiates to her back and is worth with deep breaths and movements.   Initial Impression: Vitals are stable. Significant epigastric pain. Plan to trial GI cocktail. Will work up for ACS. I also ordered a dimer since this patient is overall low risk and has a low Wells score for a PE, however the pleuritic chest pain with shortness of breath can be seen in a PE. I am concerned for dissection and esophageal rupture given back pain radiation and associated abdominal pain. Plan to obtain CT chest/abdomen/pelvis as well  Ddx: PE, ACS, Dissection, PUD, pancreatitis, hiatal hernia, gastric sleeve complication  Additional History:  Additional history obtained from n/a External records from outside source obtained and reviewed including weight management notes for  gastric sleeve Social Determinants of Health: n/a    I personally reviewed and interpreted all laboratory work and imaging . I agree with  radiologist interpretation. Abnormal results outlined below.  CBC with trace leukocytosis, stable anemia, normal BMP, normal hepatic fct panel, negative d dimer, normal lipase, negative troponins EKG NSR CXR normal CT:  1. No acute intrathoracic, abdominal, or pelvic pathology. No aortic  aneurysm or dissection.  2. A 3.3 cm left thyroid nodule. Further evaluation with ultrasound  on a nonemergent/outpatient basis recommended.     Cardiac Monitoring: The patient was maintained on a cardiac monitor.  I personally viewed and interpreted the cardiac monitored which showed an underlying rhythm of: NSR  Medications:   I ordered medication including GI cocktail  for epigastric pain Reevaluation of the patient after these medicines showed that the patient improved I have reviewed the patients home medicines and have made adjustments as needed  My Impression: patient had a completely negative workup. CT ruled out boerhaave, dissection, AAA, large hiatal hernia, bowel obstruction, and perforated ulcer. Labs are not suggestive of PE, ACS. On reevaluation and PO challenge, it was noted that patient was having significant pain several seconds after drinking a swig of water. She described those sensations as burning. I think this may be indicative of an ulcer. GI cocktail did help somewhat but did not completely take away symptoms. It is reassuring that patient is able to drink fluids. I will try a dose of carafate.I discussed with patient that she would likely benefit from an EGD at some point. I do not think that she meets admission criteria at this time, but if she continues to not tolerate PO at home then she may need to get admitted for a scope. I am going to prescribe her carafate, ppi, pepcid, and breakthrough perocets. GI f/u provided. Strict return precautions.    Disposition:  After consideration of the diagnostic results and the patients response to treatment, I feel that the patent would benefit from gi f/u, ulcer diet, ppi.  Portions of this note were generated with [employer]. Dictation errors may occur despite best attempts at proofreading.    Final Clinical Impression(s) / ED Diagnoses Final diagnoses:  Epigastric abdominal pain    Rx / DC Orders ED Discharge Orders          Ordered    sucralfate (CARAFATE) 1 g tablet  3 times daily with meals & bedtime        02/13/22 2035    pantoprazole (PROTONIX) 20 MG tablet  Daily        02/13/22 2035    famotidine (PEPCID) 40 MG tablet  Daily        02/13/22 2035    oxyCODONE-acetaminophen (PERCOCET/ROXICET) 5-325 MG tablet  Every 6 hours PRN        02/13/22 2035              Sheila Oats 02/14/22 0041    Malvin Johns, MD 02/14/22 [phone]

## 2022-02-13 NOTE — ED Notes (Signed)
Pt stated that it hurt to swallow the medication

## 2022-03-10 ENCOUNTER — Encounter (HOSPITAL_COMMUNITY): Payer: Self-pay | Admitting: Radiology

## 2022-03-18 ENCOUNTER — Emergency Department (HOSPITAL_BASED_OUTPATIENT_CLINIC_OR_DEPARTMENT_OTHER)
Admission: EM | Admit: 2022-03-18 | Discharge: 2022-03-18 | Disposition: A | Discharge status: Home / Self Care | Payer: Medicaid Other | Attending: Emergency Medicine | Admitting: Emergency Medicine

## 2022-03-18 ENCOUNTER — Encounter (HOSPITAL_BASED_OUTPATIENT_CLINIC_OR_DEPARTMENT_OTHER): Payer: Self-pay | Admitting: Emergency Medicine

## 2022-03-18 ENCOUNTER — Other Ambulatory Visit: Payer: Self-pay

## 2022-03-18 DIAGNOSIS — R059 Cough, unspecified: Secondary | ICD-10-CM | POA: Diagnosis present

## 2022-03-18 DIAGNOSIS — J101 Influenza due to other identified influenza virus with other respiratory manifestations: Secondary | ICD-10-CM | POA: Insufficient documentation

## 2022-03-18 DIAGNOSIS — Z20822 Contact with and (suspected) exposure to covid-19: Secondary | ICD-10-CM | POA: Insufficient documentation

## 2022-03-18 LAB — RESP PANEL BY RT-PCR (FLU A&B, COVID) ARPGX2
Influenza A by PCR: POSITIVE — AB
Influenza B by PCR: NEGATIVE
SARS Coronavirus 2 by RT PCR: NEGATIVE

## 2022-03-18 MED ORDER — LACTATED RINGERS IV BOLUS
1000.0000 mL | Freq: Once | INTRAVENOUS | Status: AC
  Administered 2022-03-18: 1000 mL via INTRAVENOUS

## 2022-03-18 MED ORDER — ACETAMINOPHEN 325 MG PO TABS
650.0000 mg | ORAL_TABLET | Freq: Once | ORAL | Status: AC
  Administered 2022-03-18: 650 mg via ORAL
  Filled 2022-03-18: qty 2

## 2022-03-18 NOTE — ED Triage Notes (Signed)
Pt arrives to ED with c/o cough, body aches, diarrhea, chills, fever. She reports her symptoms started x3 days ago.  ?

## 2022-03-18 NOTE — ED Provider Notes (Signed)
?[facility] EMERGENC[facility] DEPT ?Provider Note ? ? ?CSN: [id number] ?Arrival date & time: 03/18/22  1810 ? ?  ? ?History ? ?Chief Complaint  ?Patient presents with  ? Generalized Body Aches  ? ? ?Jaime Riggs is a 41 y.o. female. ? ?HPI ?Patient presents with 3 days of aches chills and generalized weakness.  Had some nausea and diarrhea.  States her son has flu.  Presents here with her other son.  States she has been unable to take a shower in the last 3 days because she feels weak.  States she has to take care of people at the house. ?  ? ?Home Medications ?Prior to Admission medications   ?Medication Sig Start Date End Date Taking? Authorizing Provider  ?acetaminophen (TYLENOL) 325 MG tablet Take 650 mg by mouth every 6 (six) hours as needed.    [provider]  ?albuterol (VENTOLIN HFA) 108 (90 Base) MCG/ACT inhaler Inhale 2 puffs into the lungs every 6 (six) hours as needed for wheezing or shortness of breath.  06/01/19   [provider]  ?ALPRAZolam Duanne Moron) 0.5 MG tablet Take 0.25-0.5 mg by mouth 2 (two) times daily as needed for anxiety.  07/27/19   [provider]  ?cetirizine (ZYRTEC) 10 MG tablet Take 10 mg by mouth at bedtime.     [provider]  ?cyclobenzaprine (FLEXERIL) 5 MG tablet Take 1 tablet (5 mg total) by mouth at bedtime as needed for muscle spasms. ?Patient taking differently: Take 5 mg by mouth at bedtime. 05/30/18   Ok Edwards, PA-C  ?Drospirenone (SLYND) 4 MG TABS Take 1 tablet by mouth daily. 07/20/21   Nugent, Gerrie Nordmann, NP  ?famotidine (PEPCID) 40 MG tablet Take 1 tablet (40 mg total) by mouth daily. 02/13/22 03/15/22  Loeffler, Adora Fridge, PA-C  ?FEROSUL 325 (65 Fe) MG tablet Take 325 mg by mouth daily. 10/10/19   [provider]  ?hydrochlorothiazide (MICROZIDE) 12.5 MG capsule Take 12.5 mg by mouth daily. 07/16/21   [provider]  ?ibuprofen (ADVIL) 800 MG tablet Take 800 mg by mouth 3 (three) times daily as needed. 05/10/21    [provider]  ?lidocaine (LIDODERM) 5 % SMARTSIG:Patch(s) Topical Daily PRN 07/20/21   [provider]  ?metFORMIN (GLUCOPHAGE) 500 MG tablet Take 500 mg by mouth 2 (two) times daily with a meal.  08/24/19   [provider]  ?Multiple Vitamins-Minerals (MULTIVITAMIN WITH MINERALS) tablet Take 1 tablet by mouth daily.    [provider]  ?nabumetone (RELAFEN) 750 MG tablet Take 1 tablet (750 mg total) by mouth 2 (two) times daily as needed. 04/06/21   Mcarthur Rossetti, MD  ?oxyCODONE-acetaminophen (PERCOCET/ROXICET) 5-325 MG tablet Take 1 tablet by mouth every 6 (six) hours as needed for severe pain. 02/13/22   Loeffler, Adora Fridge, PA-C  ?pantoprazole (PROTONIX) 20 MG tablet Take 2 tablets (40 mg total) by mouth daily. 02/13/22 03/15/22  Loeffler, Adora Fridge, PA-C  ?sertraline (ZOLOFT) 100 MG tablet Take 200 mg by mouth at bedtime. 11/13/19   [provider]  ?sucralfate (CARAFATE) 1 g tablet Take 1 tablet (1 g total) by mouth 4 (four) times daily -  with meals and at bedtime. 02/13/22 03/15/22  Loeffler, Adora Fridge, PA-C  ?   ? ?Allergies    ?Patient has no known allergies.   ? ?Review of Systems   ?Review of Systems  ?Constitutional:  Positive for appetite change and fatigue.  ?Respiratory:  Positive for cough.   ?Gastrointestinal:  Positive for diarrhea.  ?Neurological:  Negative for weakness.  ? ?Physical Exam ?Updated Vital Signs ?BP (!) 131/111 (BP Location: Right Arm)   Pulse 90   Temp 99.6 ?F (37.6 ?C)   Resp 18   Ht '5\' 8"'$  (1.727 m)   Wt (!) 145.2 kg   SpO2 100%   BMI 48.66 kg/m?  ?Physical Exam ?Vitals and nursing note reviewed.  ?HENT:  ?   Mouth/Throat:  ?   Mouth: Mucous membranes are moist.  ?Eyes:  ?   Extraocular Movements: Extraocular movements intact.  ?Cardiovascular:  ?   Rate and Rhythm: Normal rate.  ?Abdominal:  ?   Tenderness: There is no abdominal tenderness.  ?Musculoskeletal:     ?   General: No tenderness.  ?Skin: ?   Capillary Refill: Capillary  refill takes less than 2 seconds.  ?Neurological:  ?   Mental Status: She is alert and oriented to person, place, and time.  ? ? ?ED Results / Procedures / Treatments   ?Labs ?(all labs ordered are listed, but only abnormal results are displayed) ?Labs Reviewed  ?RESP PANEL BY RT-PCR (FLU A&B, COVID) ARPGX2 - Abnormal; Notable for the following components:  ?    Result Value  ? Influenza A by PCR POSITIVE (*)   ? All other components within normal limits  ? ? ?EKG ?None ? ?Radiology ?No results found. ? ?Procedures ?Procedures  ? ? ?Medications Ordered in ED ?Medications  ?lactated ringers bolus 1,000 mL (1,000 mLs Intravenous New Bag/Given 03/18/22 2021)  ?acetaminophen (TYLENOL) tablet 650 mg (650 mg Oral Given 03/18/22 2037)  ? ? ?ED Course/ Medical Decision Making/ A&P ?  ?                        ?Medical Decision Making ?Risk ?OTC drugs. ? ?Patient with diarrhea muscle aches some nausea.  Has had sick contacts with son with flu.  Well-appearing overall but states she has not been able to eat take a shower because she is so weak.  Vitals reassuring but COVID test negative.  Flu test positive however.  Fluid bolus given and feels much better.  Appears stable for discharge.  Do not feels of any more blood work at this time.  Will discharge home.  Patient has had 3 days of symptoms and is out of the effective window of Tamiflu. ? ? ? ? ? ? ? ? ?Final Clinical Impression(s) / ED Diagnoses ?Final diagnoses:  ?Influenza A  ? ? ?Rx / DC Orders ?ED Discharge Orders   ? ? None  ? ?  ? ? ?  ?Davonna Belling, MD ?03/18/22 2111 ? ?

## 2022-04-14 ENCOUNTER — Other Ambulatory Visit: Payer: Self-pay

## 2022-04-14 ENCOUNTER — Encounter (HOSPITAL_BASED_OUTPATIENT_CLINIC_OR_DEPARTMENT_OTHER): Payer: Self-pay

## 2022-04-14 ENCOUNTER — Emergency Department (HOSPITAL_BASED_OUTPATIENT_CLINIC_OR_DEPARTMENT_OTHER)
Admission: EM | Admit: 2022-04-14 | Discharge: 2022-04-15 | Disposition: A | Discharge status: Home / Self Care | Payer: Medicaid Other | Attending: Emergency Medicine | Admitting: Emergency Medicine

## 2022-04-14 ENCOUNTER — Emergency Department (HOSPITAL_BASED_OUTPATIENT_CLINIC_OR_DEPARTMENT_OTHER): Payer: Medicaid Other

## 2022-04-14 DIAGNOSIS — J4521 Mild intermittent asthma with (acute) exacerbation: Secondary | ICD-10-CM | POA: Insufficient documentation

## 2022-04-14 DIAGNOSIS — R059 Cough, unspecified: Secondary | ICD-10-CM | POA: Diagnosis present

## 2022-04-14 DIAGNOSIS — E119 Type 2 diabetes mellitus without complications: Secondary | ICD-10-CM | POA: Diagnosis not present

## 2022-04-14 DIAGNOSIS — Z7984 Long term (current) use of oral hypoglycemic drugs: Secondary | ICD-10-CM | POA: Diagnosis not present

## 2022-04-14 DIAGNOSIS — Z7951 Long term (current) use of inhaled steroids: Secondary | ICD-10-CM | POA: Insufficient documentation

## 2022-04-14 LAB — CBC WITH DIFFERENTIAL/PLATELET
Abs Immature Granulocytes: 0.05 10*3/uL (ref 0.00–0.07)
Basophils Absolute: 0.1 10*3/uL (ref 0.0–0.1)
Basophils Relative: 1 %
Eosinophils Absolute: 0.1 10*3/uL (ref 0.0–0.5)
Eosinophils Relative: 0 %
HCT: 34.3 % — ABNORMAL LOW (ref 36.0–46.0)
Hemoglobin: 10.7 g/dL — ABNORMAL LOW (ref 12.0–15.0)
Immature Granulocytes: 0 %
Lymphocytes Relative: 26 %
Lymphs Abs: 3.5 10*3/uL (ref 0.7–4.0)
MCH: 23.8 pg — ABNORMAL LOW (ref 26.0–34.0)
MCHC: 31.2 g/dL (ref 30.0–36.0)
MCV: 76.4 fL — ABNORMAL LOW (ref 80.0–100.0)
Monocytes Absolute: 0.9 10*3/uL (ref 0.1–1.0)
Monocytes Relative: 7 %
Neutro Abs: 8.7 10*3/uL — ABNORMAL HIGH (ref 1.7–7.7)
Neutrophils Relative %: 66 %
Platelets: 234 10*3/uL (ref 150–400)
RBC: 4.49 MIL/uL (ref 3.87–5.11)
RDW: 17.8 % — ABNORMAL HIGH (ref 11.5–15.5)
WBC: 13.2 10*3/uL — ABNORMAL HIGH (ref 4.0–10.5)
nRBC: 0 % (ref 0.0–0.2)

## 2022-04-14 LAB — D-DIMER, QUANTITATIVE: D-Dimer, Quant: 0.35 ug/mL-FEU (ref 0.00–0.50)

## 2022-04-14 LAB — TROPONIN I (HIGH SENSITIVITY): Troponin I (High Sensitivity): 2 ng/L (ref <18)

## 2022-04-14 MED ORDER — AEROCHAMBER PLUS FLO-VU MISC
1.0000 | Freq: Once | Status: AC
  Administered 2022-04-14: 1
  Filled 2022-04-14: qty 1

## 2022-04-14 MED ORDER — IPRATROPIUM-ALBUTEROL 0.5-2.5 (3) MG/3ML IN SOLN
3.0000 mL | Freq: Once | RESPIRATORY_TRACT | Status: AC
  Administered 2022-04-14: 3 mL via RESPIRATORY_TRACT
  Filled 2022-04-14: qty 3

## 2022-04-14 MED ORDER — ALBUTEROL SULFATE HFA 108 (90 BASE) MCG/ACT IN AERS
2.0000 | INHALATION_SPRAY | RESPIRATORY_TRACT | Status: DC | PRN
Start: 2022-04-14 — End: 2022-04-15
  Administered 2022-04-14: 2 via RESPIRATORY_TRACT
  Filled 2022-04-14: qty 67

## 2022-04-14 NOTE — ED Provider Notes (Signed)
?[facility] EMERGENCY DEPT ?Provider Note ? ? ?CSN: [id number] ?Arrival date & time: 04/14/22  2044 ? ?  ? ?History ? ?Chief Complaint  ?Patient presents with  ? Shortness of Breath  ? ? ?Jaime Riggs is a 41 y.o. female. ? ?HPI ? ?  ? ?End of march had the flu ?Everyone else got better but she continued coughing ?Hx of bad allergies, hx of asthma but never been like that  ?The cough never went away, kids did, hers stayed and got worse ?Can go walk to throw away something and dyspnea ?Has hx of OSA ?Was wheezing , took inhaler at home 20 times and kept coughing and chest hurting and was scared. Like an aching pain and back aching and hurting at the top over the last 3 weeks.  Is worse with deep breaths.  Chest soreness, a little sore now, and bac, ?Arthritis knee, but not having other leg pain or swelling ?Continued cough, yellow productive  ? ?No hx of DVT/OPE, on OCP, off of estrogen based one, no recent surgeries or long trips ? ?No fevers ? ? ?Past Medical History:  ?Diagnosis Date  ? Asthma   ? Diabetes mellitus without complication ([place])   ? Difficult intubation 12/06/2019  ? TMJ; small mouth; limited neck extension; redundant soft tissue  ? Epilepsy ([place])   ? last episode  two yrs.  ? GERD (gastroesophageal reflux disease)   ? Migraines   ? Neck mass   ? Obesity (BMI 30-39.9) 03/27/2012  ? PONV (postoperative nausea and vomiting)   ? PTSD (post-traumatic stress disorder) 1992  ? from childhood experiences  ? Shortness of breath   ? with preg with multiple only  ? Sleep apnea   ?  ? ?Home Medications ?Prior to Admission medications   ?Medication Sig Start Date End Date Taking? Authorizing Provider  ?predniSONE (DELTASONE) 20 MG tablet Take 3 tablets (60 mg total) by mouth daily. 04/15/22  Yes Truddie Hidden, MD  ?acetaminophen (TYLENOL) 325 MG tablet Take 650 mg by mouth every 6 (six) hours as needed.    [provider]  ?albuterol (VENTOLIN HFA) 108 (90 Base) MCG/ACT inhaler Inhale  2 puffs into the lungs every 6 (six) hours as needed for wheezing or shortness of breath.  06/01/19   [provider]  ?ALPRAZolam Duanne Moron) 0.5 MG tablet Take 0.25-0.5 mg by mouth 2 (two) times daily as needed for anxiety.  07/27/19   [provider]  ?cetirizine (ZYRTEC) 10 MG tablet Take 10 mg by mouth at bedtime.     [provider]  ?cyclobenzaprine (FLEXERIL) 5 MG tablet Take 1 tablet (5 mg total) by mouth at bedtime as needed for muscle spasms. ?Patient taking differently: Take 5 mg by mouth at bedtime. 05/30/18   Ok Edwards, PA-C  ?Drospirenone (SLYND) 4 MG TABS Take 1 tablet by mouth daily. 07/20/21   Nugent, Gerrie Nordmann, NP  ?famotidine (PEPCID) 40 MG tablet Take 1 tablet (40 mg total) by mouth daily. 02/13/22 03/15/22  Loeffler, Adora Fridge, PA-C  ?FEROSUL 325 (65 Fe) MG tablet Take 325 mg by mouth daily. 10/10/19   [provider]  ?hydrochlorothiazide (MICROZIDE) 12.5 MG capsule Take 12.5 mg by mouth daily. 07/16/21   [provider]  ?ibuprofen (ADVIL) 800 MG tablet Take 800 mg by mouth 3 (three) times daily as needed. 05/10/21   [provider]  ?lidocaine (LIDODERM) 5 % SMARTSIG:Patch(s) Topical Daily PRN 07/20/21   [provider]  ?metFORMIN (  GLUCOPHAGE) 500 MG tablet Take 500 mg by mouth 2 (two) times daily with a meal.  08/24/19   [provider]  ?Multiple Vitamins-Minerals (MULTIVITAMIN WITH MINERALS) tablet Take 1 tablet by mouth daily.    [provider]  ?nabumetone (RELAFEN) 750 MG tablet Take 1 tablet (750 mg total) by mouth 2 (two) times daily as needed. 04/06/21   Mcarthur Rossetti, MD  ?oxyCODONE-acetaminophen (PERCOCET/ROXICET) 5-325 MG tablet Take 1 tablet by mouth every 6 (six) hours as needed for severe pain. 02/13/22   Loeffler, Adora Fridge, PA-C  ?pantoprazole (PROTONIX) 20 MG tablet Take 2 tablets (40 mg total) by mouth daily. 02/13/22 03/15/22  Loeffler, Adora Fridge, PA-C  ?sertraline (ZOLOFT) 100 MG tablet Take 200 mg by mouth  at bedtime. 11/13/19   [provider]  ?sucralfate (CARAFATE) 1 g tablet Take 1 tablet (1 g total) by mouth 4 (four) times daily -  with meals and at bedtime. 02/13/22 03/15/22  Loeffler, Adora Fridge, PA-C  ?   ? ?Allergies    ?Patient has no known allergies.   ? ?Review of Systems   ?Review of Systems ? ?Physical Exam ?Updated Vital Signs ?BP (!) 141/84 (BP Location: Right Arm)   Pulse 86   Temp 98.8 ?F (37.1 ?C)   Resp 18   Ht '5\' 8"'$  (1.727 m)   Wt (!) 145.2 kg   SpO2 100%   BMI 48.67 kg/m?  ?Physical Exam ?Vitals and nursing note reviewed.  ?Constitutional:   ?   General: She is not in acute distress. ?   Appearance: She is well-developed. She is not diaphoretic.  ?HENT:  ?   Head: Normocephalic and atraumatic.  ?Eyes:  ?   Conjunctiva/sclera: Conjunctivae normal.  ?Cardiovascular:  ?   Rate and Rhythm: Normal rate and regular rhythm.  ?   Heart sounds: Normal heart sounds. No murmur heard. ?  No friction rub. No gallop.  ?Pulmonary:  ?   Effort: Pulmonary effort is normal. No respiratory distress.  ?   Breath sounds: Normal breath sounds. No wheezing or rales.  ?Abdominal:  ?   General: There is no distension.  ?   Palpations: Abdomen is soft.  ?   Tenderness: There is no abdominal tenderness. There is no guarding.  ?Musculoskeletal:     ?   General: No tenderness.  ?   Cervical back: Normal range of motion.  ?Skin: ?   General: Skin is warm and dry.  ?   Findings: No erythema or rash.  ?Neurological:  ?   Mental Status: She is alert and oriented to person, place, and time.  ? ? ?ED Results / Procedures / Treatments   ?Labs ?(all labs ordered are listed, but only abnormal results are displayed) ?Labs Reviewed  ?CBC WITH DIFFERENTIAL/PLATELET - Abnormal; Notable for the following components:  ?    Result Value  ? WBC 13.2 (*)   ? Hemoglobin 10.7 (*)   ? HCT 34.3 (*)   ? MCV 76.4 (*)   ? MCH 23.8 (*)   ? RDW 17.8 (*)   ? Neutro Abs 8.7 (*)   ? All other components within normal limits  ?COMPREHENSIVE  METABOLIC PANEL - Abnormal; Notable for the following components:  ? Potassium 3.4 (*)   ? Total Bilirubin 0.2 (*)   ? All other components within normal limits  ?D-DIMER, QUANTITATIVE (NOT AT [facility])  ?TROPONIN I (HIGH SENSITIVITY)  ? ? ?EKG ?EKG Interpretation ? ?Date/Time:  Wednesday April 14 2022 20:53:23 EDT ?Ventricular Rate:  105 ?PR Interval:  194 ?QRS Duration: 76 ?QT Interval:  334 ?QTC Calculation: 441 ?R Axis:   -13 ?Text Interpretation: Sinus tachycardia Possible Anterior infarct , age undetermined Abnormal ECG When compared with ECG of 13-Feb-2022 15:50, T wave inversion now evident in Inferior leads Since prior ECG, rate has increased Confirmed by Gareth Morgan [phone]) on 04/14/2022 10:10:44 PM ? ?Radiology ?DG Chest Portable 1 View ? ?Result Date: 04/14/2022 ?CLINICAL DATA:  Shortness of breath. EXAM: PORTABLE CHEST 1 VIEW COMPARISON:  02/13/2022 FINDINGS: Stable cardiomediastinal contours. No pleural effusion or edema identified. No airspace densities identified. The visualized osseous structures are unremarkable. IMPRESSION: No acute cardiopulmonary abnormalities. Electronically Signed   By: Kerby Moors M.D.   On: 04/14/2022 21:13   ? ?Procedures ?Procedures  ? ? ?Medications Ordered in ED ?Medications  ?ipratropium-albuterol (DUONEB) 0.5-2.5 (3) MG/3ML nebulizer solution 3 mL (3 mLs Nebulization Given by Other 04/14/22 2120)  ?aerochamber plus with mask device 1 each (1 each Other Given 04/14/22 2123)  ?predniSONE (DELTASONE) tablet 60 mg (60 mg Oral Given 04/15/22 0019)  ? ? ?ED Course/ Medical Decision Making/ A&P ?Clinical Course as of 04/15/22 0950  ?Wed Apr 14, 2022  ?2344 CBC with mild leukocytosis and mild anemia.  [CS]  ?Thu Apr 15, 2022  ?0000 CMP, Trop and dimer are neg.  [CS]  ?0008 Patient feeling better and would like to go home. Will begin prednisone. Continue inhaler at home. Follow up with Pulm.  [CS]  ?  ?Clinical Course User Index ?[CS] Truddie Hidden, MD  ? ?                         ?Medical Decision Making ?Amount and/or Complexity of Data Reviewed ?Labs: ordered. ?Radiology: ordered. ? ?Risk ?Prescription drug management. ? ? ?41yo female with history of asthma, DM, OSA, presents

## 2022-04-14 NOTE — ED Notes (Signed)
RT educated pt on proper use of MDI w/spacer.  ?

## 2022-04-14 NOTE — ED Notes (Signed)
RT educated pt on proper use of MDI w/spacer. Pt also educated on self management of asthma symptoms. Pt verbalizes understanding of information and is going to see pulmonologist in a couple of weeks for reassessment.  ?

## 2022-04-14 NOTE — ED Triage Notes (Signed)
Patient here POV from Home. ? ?Endorses being diagnosed with Flu in Late March and since then there has been a Cough associated with SOB that has been worsening. ? ?History of Asthma and Sleep Apnea. No Relief from Inhaler. ? ?SOB during Triage. A&Ox4. GCS 15. Ambulatory. ?

## 2022-04-15 LAB — COMPREHENSIVE METABOLIC PANEL
ALT: 17 U/L (ref 0–44)
AST: 19 U/L (ref 15–41)
Albumin: 4.1 g/dL (ref 3.5–5.0)
Alkaline Phosphatase: 105 U/L (ref 38–126)
Anion gap: 8 (ref 5–15)
BUN: 13 mg/dL (ref 6–20)
CO2: 25 mmol/L (ref 22–32)
Calcium: 9 mg/dL (ref 8.9–10.3)
Chloride: 105 mmol/L (ref 98–111)
Creatinine, Ser: 0.7 mg/dL (ref 0.44–1.00)
GFR, Estimated: >60 mL/min (ref >60)
Glucose, Bld: 93 mg/dL (ref 70–99)
Potassium: 3.4 mmol/L — ABNORMAL LOW (ref 3.5–5.1)
Sodium: 138 mmol/L (ref 135–145)
Total Bilirubin: 0.2 mg/dL — ABNORMAL LOW (ref 0.3–1.2)
Total Protein: 7.2 g/dL (ref 6.5–8.1)

## 2022-04-15 MED ORDER — PREDNISONE 20 MG PO TABS: 60.0000 mg | ORAL_TABLET | Freq: Every day | ORAL | 0 refills | Status: AC

## 2022-04-15 MED ORDER — PREDNISONE 50 MG PO TABS
60.0000 mg | ORAL_TABLET | Freq: Once | ORAL | Status: AC
  Administered 2022-04-15: 60 mg via ORAL
  Filled 2022-04-15: qty 1

## 2022-04-15 NOTE — ED Provider Notes (Signed)
Care of the patient assumed at the change of shift. Here for asthma exacerbation. Feeling better after nebs. Labs pending.  ?Physical Exam  ?BP 136/80 (BP Location: Right Arm)   Pulse 83   Temp 98.8 ?F (37.1 ?C)   Resp 20   Ht '5\' 8"'$  (1.727 m)   Wt (!) 145.2 kg   SpO2 97%   BMI 48.67 kg/m?  ? ?Physical Exam ?Awake Alert ?Resp even and unlabored ? ?Procedures  ?Procedures ? ?ED Course / MDM  ? ?Clinical Course as of 04/15/22 0010  ?Wed Apr 14, 2022  ?2344 CBC with mild leukocytosis and mild anemia.  [CS]  ?Thu Apr 15, 2022  ?0000 CMP, Trop and dimer are neg.  [CS]  ?0008 Patient feeling better and would like to go home. Will begin prednisone. Continue inhaler at home. Follow up with Pulm.  [CS]  ?  ?Clinical Course User Index ?[CS] Truddie Hidden, MD  ? ?Medical Decision Making ?Problems Addressed: ?Mild intermittent asthma with exacerbation: acute illness or injury ? ?Amount and/or Complexity of Data Reviewed ?Labs: ordered. Decision-making details documented in ED Course. ?Radiology: ordered and independent interpretation performed. Decision-making details documented in ED Course. ? ?Risk ?Prescription drug management. ? ? ? ? ? ? ? ?  ?Truddie Hidden, MD ?04/15/22 0010 ? ?

## 2022-07-13 ENCOUNTER — Other Ambulatory Visit: Payer: Self-pay | Admitting: *Deleted

## 2022-07-13 DIAGNOSIS — Z3009 Encounter for other general counseling and advice on contraception: Secondary | ICD-10-CM

## 2022-07-13 MED ORDER — SLYND 4 MG PO TABS: 1.0000 | ORAL_TABLET | Freq: Every day | ORAL | 2 refills | Status: DC

## 2022-07-13 NOTE — Progress Notes (Signed)
Refill on BC pills sent today- noted that pt will need AEX to continue Rx.

## 2022-10-10 ENCOUNTER — Other Ambulatory Visit: Payer: Self-pay | Admitting: Obstetrics and Gynecology

## 2022-10-10 DIAGNOSIS — Z3009 Encounter for other general counseling and advice on contraception: Secondary | ICD-10-CM

## 2023-04-28 ENCOUNTER — Emergency Department (HOSPITAL_BASED_OUTPATIENT_CLINIC_OR_DEPARTMENT_OTHER): Payer: Medicaid Other

## 2023-04-28 ENCOUNTER — Encounter (HOSPITAL_BASED_OUTPATIENT_CLINIC_OR_DEPARTMENT_OTHER): Payer: Self-pay

## 2023-04-28 ENCOUNTER — Other Ambulatory Visit: Payer: Self-pay

## 2023-04-28 ENCOUNTER — Emergency Department (HOSPITAL_BASED_OUTPATIENT_CLINIC_OR_DEPARTMENT_OTHER)
Admission: EM | Admit: 2023-04-28 | Discharge: 2023-04-28 | Disposition: A | Discharge status: Other Acute Inpatient Hospital | Payer: Medicaid Other | Attending: Emergency Medicine | Admitting: Emergency Medicine

## 2023-04-28 DIAGNOSIS — Z7984 Long term (current) use of oral hypoglycemic drugs: Secondary | ICD-10-CM | POA: Insufficient documentation

## 2023-04-28 DIAGNOSIS — R1084 Generalized abdominal pain: Secondary | ICD-10-CM

## 2023-04-28 DIAGNOSIS — K436 Other and unspecified ventral hernia with obstruction, without gangrene: Secondary | ICD-10-CM | POA: Insufficient documentation

## 2023-04-28 DIAGNOSIS — K458 Other specified abdominal hernia without obstruction or gangrene: Secondary | ICD-10-CM

## 2023-04-28 DIAGNOSIS — D72829 Elevated white blood cell count, unspecified: Secondary | ICD-10-CM | POA: Insufficient documentation

## 2023-04-28 DIAGNOSIS — E119 Type 2 diabetes mellitus without complications: Secondary | ICD-10-CM | POA: Diagnosis not present

## 2023-04-28 DIAGNOSIS — R109 Unspecified abdominal pain: Secondary | ICD-10-CM | POA: Diagnosis present

## 2023-04-28 LAB — COMPREHENSIVE METABOLIC PANEL
ALT: 29 U/L (ref 0–44)
AST: 30 U/L (ref 15–41)
Albumin: 3.9 g/dL (ref 3.5–5.0)
Alkaline Phosphatase: 111 U/L (ref 38–126)
Anion gap: 7 (ref 5–15)
BUN: 10 mg/dL (ref 6–20)
CO2: 25 mmol/L (ref 22–32)
Calcium: 8.5 mg/dL — ABNORMAL LOW (ref 8.9–10.3)
Chloride: 108 mmol/L (ref 98–111)
Creatinine, Ser: 0.59 mg/dL (ref 0.44–1.00)
GFR, Estimated: >60 mL/min (ref >60)
Glucose, Bld: 90 mg/dL (ref 70–99)
Potassium: 4.6 mmol/L (ref 3.5–5.1)
Sodium: 140 mmol/L (ref 135–145)
Total Bilirubin: 0.3 mg/dL (ref 0.3–1.2)
Total Protein: 6.6 g/dL (ref 6.5–8.1)

## 2023-04-28 LAB — CBC
HCT: 35.7 % — ABNORMAL LOW (ref 36.0–46.0)
Hemoglobin: 11.2 g/dL — ABNORMAL LOW (ref 12.0–15.0)
MCH: 25.6 pg — ABNORMAL LOW (ref 26.0–34.0)
MCHC: 31.4 g/dL (ref 30.0–36.0)
MCV: 81.7 fL (ref 80.0–100.0)
Platelets: 159 10*3/uL (ref 150–400)
RBC: 4.37 MIL/uL (ref 3.87–5.11)
RDW: 16.3 % — ABNORMAL HIGH (ref 11.5–15.5)
WBC: 10.6 10*3/uL — ABNORMAL HIGH (ref 4.0–10.5)
nRBC: 0 % (ref 0.0–0.2)

## 2023-04-28 LAB — HCG, SERUM, QUALITATIVE: Preg, Serum: NEGATIVE

## 2023-04-28 LAB — LIPASE, BLOOD: Lipase: 26 U/L (ref 11–51)

## 2023-04-28 MED ORDER — ONDANSETRON HCL 4 MG/2ML IJ SOLN
4.0000 mg | Freq: Once | INTRAMUSCULAR | Status: AC
  Administered 2023-04-28: 4 mg via INTRAVENOUS
  Filled 2023-04-28: qty 2

## 2023-04-28 MED ORDER — IOHEXOL 300 MG/ML  SOLN
100.0000 mL | Freq: Once | INTRAMUSCULAR | Status: AC | PRN
  Administered 2023-04-28: 100 mL via INTRAVENOUS

## 2023-04-28 MED ORDER — MORPHINE SULFATE (PF) 4 MG/ML IV SOLN
4.0000 mg | Freq: Once | INTRAVENOUS | Status: AC
  Administered 2023-04-28: 4 mg via INTRAVENOUS
  Filled 2023-04-28: qty 1

## 2023-04-28 MED ORDER — HYDROMORPHONE HCL 1 MG/ML IJ SOLN
0.5000 mg | Freq: Once | INTRAMUSCULAR | Status: AC
  Administered 2023-04-28: 0.5 mg via INTRAVENOUS
  Filled 2023-04-28: qty 1

## 2023-04-28 NOTE — ED Notes (Signed)
[facility] recalled

## 2023-04-28 NOTE — ED Notes (Signed)
[facility] called for transport.  

## 2023-04-28 NOTE — ED Triage Notes (Signed)
Patient here POV from Home.  Endorses ABD Pain possibly related to Hernia for 1-2 Hours. Notes Nausea and Distention. No Emesis or Diarrhea.  NAD Noted during triage. A&Ox4. Gcs 15. Ambulatory.

## 2023-04-28 NOTE — ED Provider Notes (Signed)
[place] EMERGENCY DEPARTMENT AT [facility] Provider Note   CSN: [id number] Arrival date & time: 04/28/23  1344    History  Chief Complaint  Patient presents with   Abdominal Pain    WINNONA Riggs is a 42 y.o. female history of type 2 diabetes, obesity, RA, prior gastric sleeve resection, hernia repair here for evaluation of nausea and abdominal pain.  Noted a ventral wall hernia which was previously nontender however earlier today she noted pain and nausea to site.  No emesis.  This was performed at [facility] for her prior surgeries.  Change in bowel movements.  Has never been diagnosed with an incarcerated or strangulated hernia.  States she did not have mesh placed with her prior hernia repair.  No fever.  Was otherwise well up until earlier today  HPI     Home Medications Prior to Admission medications   Medication Sig Start Date End Date Taking? Authorizing Provider  Vitamin D, Ergocalciferol, (DRISDOL) 1.25 MG (50000 UNIT) CAPS capsule Take 50,000 Units by mouth every 7 (seven) days. 02/08/22  Yes [provider]  acetaminophen (TYLENOL) 325 MG tablet Take 650 mg by mouth every 6 (six) hours as needed.    [provider]  albuterol (VENTOLIN HFA) 108 (90 Base) MCG/ACT inhaler Inhale 2 puffs into the lungs every 6 (six) hours as needed for wheezing or shortness of breath.  06/01/19   [provider]  ALPRAZolam Prudy Feeler) 0.5 MG tablet Take 0.25-0.5 mg by mouth 2 (two) times daily as needed for anxiety.  07/27/19   [provider]  cetirizine (ZYRTEC) 10 MG tablet Take 10 mg by mouth at bedtime.     [provider]  cyclobenzaprine (FLEXERIL) 5 MG tablet Take 1 tablet (5 mg total) by mouth at bedtime as needed for muscle spasms. Patient taking differently: Take 5 mg by mouth at bedtime. 05/30/18   Cathie Hoops, Amy V, PA-C  famotidine (PEPCID) 40 MG tablet Take 1 tablet (40 mg total) by mouth daily. 02/13/22 03/15/22  Loeffler, Finis Bud, PA-C   FEROSUL 325 (65 Fe) MG tablet Take 325 mg by mouth daily. 10/10/19   [provider]  hydrochlorothiazide (MICROZIDE) 12.5 MG capsule Take 12.5 mg by mouth daily. 07/16/21   [provider]  ibuprofen (ADVIL) 800 MG tablet Take 800 mg by mouth 3 (three) times daily as needed. 05/10/21   [provider]  lidocaine (LIDODERM) 5 % SMARTSIG:Patch(s) Topical Daily PRN 07/20/21   [provider]  metFORMIN (GLUCOPHAGE) 500 MG tablet Take 500 mg by mouth 2 (two) times daily with a meal.  08/24/19   [provider]  Multiple Vitamins-Minerals (MULTIVITAMIN WITH MINERALS) tablet Take 1 tablet by mouth daily.    [provider]  nabumetone (RELAFEN) 750 MG tablet Take 1 tablet (750 mg total) by mouth 2 (two) times daily as needed. 04/06/21   Kathryne Hitch, MD  oxyCODONE-acetaminophen (PERCOCET/ROXICET) 5-325 MG tablet Take 1 tablet by mouth every 6 (six) hours as needed for severe pain. 02/13/22   Loeffler, Finis Bud, PA-C  pantoprazole (PROTONIX) 20 MG tablet Take 2 tablets (40 mg total) by mouth daily. 02/13/22 03/15/22  Loeffler, Finis Bud, PA-C  predniSONE (DELTASONE) 20 MG tablet Take 3 tablets (60 mg total) by mouth daily. 04/15/22   Pollyann Savoy, MD  sertraline (ZOLOFT) 100 MG tablet Take 200 mg by mouth at bedtime. 11/13/19   [provider]  SLYND 4 MG TABS TAKE 1 TABLET BY MOUTH  DAILY 10/12/22   Warden Fillers, MD  sucralfate (CARAFATE) 1 g tablet Take 1 tablet (1 g total) by mouth 4 (four) times daily -  with meals and at bedtime. 02/13/22 03/15/22  Loeffler, Finis Bud, PA-C      Allergies    Patient has no known allergies.    Review of Systems   Review of Systems  Constitutional: Negative.   HENT: Negative.    Respiratory: Negative.    Cardiovascular: Negative.   Gastrointestinal:  Positive for abdominal pain and nausea. Negative for abdominal distention, anal bleeding, blood in stool, constipation, diarrhea, rectal pain and  vomiting.  Genitourinary: Negative.   Musculoskeletal: Negative.   Skin: Negative.   Neurological: Negative.   All other systems reviewed and are negative.   Physical Exam Updated Vital Signs BP 127/81   Pulse 60   Temp 98.6 F (37 C)   Resp 18   Ht 5\' 7"  (1.702 m)   Wt 108.9 kg   SpO2 97%   BMI 37.59 kg/m  Physical Exam Vitals and nursing note reviewed.  Constitutional:      General: She is not in acute distress.    Appearance: She is well-developed. She is obese. She is not ill-appearing, toxic-appearing or diaphoretic.  HENT:     Head: Atraumatic.  Eyes:     Pupils: Pupils are equal, round, and reactive to light.  Cardiovascular:     Rate and Rhythm: Normal rate.  Pulmonary:     Effort: No respiratory distress.  Abdominal:     General: Bowel sounds are normal. There is no distension.     Palpations: Abdomen is soft.     Tenderness: There is abdominal tenderness in the epigastric area and periumbilical area.     Hernia: A hernia is present. Hernia is present in the ventral area.     Comments: Hernia mid abdomen consistent with likely ventral wall hernia.  No overlying erythema or warmth.  Musculoskeletal:        General: Normal range of motion.     Cervical back: Normal range of motion.  Skin:    General: Skin is warm and dry.  Neurological:     General: No focal deficit present.     Mental Status: She is alert.  Psychiatric:        Mood and Affect: Mood normal.    ED Results / Procedures / Treatments   Labs (all labs ordered are listed, but only abnormal results are displayed) Labs Reviewed  COMPREHENSIVE METABOLIC PANEL - Abnormal; Notable for the following components:      Result Value   Calcium 8.5 (*)    All other components within normal limits  CBC - Abnormal; Notable for the following components:   WBC 10.6 (*)    Hemoglobin 11.2 (*)    HCT 35.7 (*)    MCH 25.6 (*)    RDW 16.3 (*)    All other components within normal limits  LIPASE, BLOOD   HCG, SERUM, QUALITATIVE    EKG None  Radiology CT ABDOMEN PELVIS W CONTRAST  Result Date: 04/28/2023 CLINICAL DATA:  Nausea pain EXAM: CT ABDOMEN AND PELVIS WITH CONTRAST TECHNIQUE: Multidetector CT imaging of the abdomen and pelvis was performed using the standard protocol following bolus administration of intravenous contrast. RADIATION DOSE REDUCTION: This exam was performed according to the departmental dose-optimization program which includes automated exposure control, adjustment of the mA and/or kV according to patient size and/or use of iterative reconstruction technique. CONTRAST:  OMNIPAQUE IOHEXOL 300 MG/ML  SOLN COMPARISON:  CT 02/14/2020 FINDINGS: Lower chest: Lung bases demonstrate no acute airspace disease. Hepatobiliary: No focal liver abnormality is seen. No gallstones, gallbladder wall thickening, or biliary dilatation. Periportal hypodensity suggestive of mild edema. Pancreas: Unremarkable. No pancreatic ductal dilatation or surrounding inflammatory changes. Spleen: Normal in size without focal abnormality. Adrenals/Urinary Tract: Adrenal glands are normal. Kidneys show no hydronephrosis. Bladder is unremarkable Stomach/Bowel: Postsurgical changes of the stomach consistent with history of prior gastric sleeve surgery. Interval postoperative changes of the duodenum/proximal small bowel. There are fluid-filled dilated loops of small bowel within the central abdomen and pelvis. Abnormal edema and fluid within right abdominal mesentery with slightly thickened appearance of the associated small bowel, for example series 2, image 60 pleural slight clustered/swirled appearance of mesenteric vessels within the upper pelvis, series 2 image 47 through 53. This is in the vicinity of a surgical suture line. Negative for intramural air. A normal duodenal jejunal junction is not identified. Vascular/Lymphatic: Nonaneurysmal aorta.  No suspicious lymph nodes. Reproductive: Uterus and bilateral  adnexa are unremarkable. Other: No free air. Small volume free fluid in the pelvis. Small umbilical hernia containing fat and mild soft tissue density. Small supraumbilical ventral hernia containing only fat, some thickening and stranding in the hernia sac. Musculoskeletal: No acute osseous abnormality. IMPRESSION: 1. Interval postoperative changes of the stomach, duodenum and small bowel. There are fluid-filled dilated loops of small bowel within the central abdomen and pelvis with abnormal edema and fluid within the right abdominal mesentery; small bowel associated with the abnormal appearing mesentery demonstrates mild wall thickening. There is slight clustered/swirled appearance of mesenteric vessels within the upper pelvis in the vicinity of a surgical suture line and associated abnormal mesentery and small bowel. Collective findings are suspicious for bowel obstruction, potentially related to internal hernia or volvulus. No intramural air or free air is visualized. Surgical consultation is recommend. 2. Small fat containing supraumbilical ventral hernia with some interval thickening of hernia sac and small stranding within, possibly due to mild incarcerated fat hernia. 3. Small volume free fluid in the pelvis. Electronically Signed   By: Jasmine Pang M.D.   On: 04/28/2023 16:39    Procedures .Critical Care  Performed by: Linwood Dibbles, PA-C Authorized by: Linwood Dibbles, PA-C   Critical care provider statement:    Critical care time (minutes):  35   Critical care was time spent personally by me on the following activities:  Development of treatment plan with patient or surrogate, discussions with consultants, evaluation of patient's response to treatment, examination of patient, ordering and review of laboratory studies, ordering and review of radiographic studies, ordering and performing treatments and interventions, pulse oximetry, re-evaluation of patient's condition and review of old  charts     Medications Ordered in ED Medications  ondansetron (ZOFRAN) injection 4 mg (4 mg Intravenous Given 04/28/23 1525)  morphine (PF) 4 MG/ML injection 4 mg (4 mg Intravenous Given 04/28/23 1532)  iohexol (OMNIPAQUE) 300 MG/ML solution 100 mL (100 mLs Intravenous Contrast Given 04/28/23 1559)  HYDROmorphone (DILAUDID) injection 0.5 mg (0.5 mg Intravenous Given 04/28/23 1709)  ondansetron (ZOFRAN) injection 4 mg (4 mg Intravenous Given 04/28/23 1705)    ED Course/ Medical Decision Making/ A&P    42 year old here for evaluation of abdominal pain, nausea and decreased bowel movements.  She has had multiple surgical procedures.  She feels like her hernia to her umbilical region is larger and more painful than previously.  No fever, bloody  stool.  She has reproducible supraumbilical/ventral hernia without surrounding redness or warmth.  She has old laparoscopic surgical incision sites without obvious infection.  Will plan on labs and imaging  Labs and imaging personally viewed and interpreted:  CBC leukocytosis 10.6, hemoglobin 1.2 Metabolic panel without significant findings Lipase 26 Pregnancy test negative  CT scan with concern for small bowel obstruction due to internal hernia versus volvulus.  She also has a supraumbilical hernia containing fat, possible incarceration however this is reducible on my exam.  I discussed results with patient, family in room.  Will touch base with [facility] given this is where she follows for prior surgical procedures  Discussed with Dr. Lowell Guitar with [facility] health.  Agrees to accept patient in transfer.  Will be going ED to ED.  Patient will be sent with CD of CT scan as well as radiology has pushed images over to [facility].  Discussed transfer with patient.  Discussed risk versus benefit.  She is agreeable for transfer.  She will remain n.p.o. until arrival at [facility] for likely emergent surgical intervention   EMTALA completed                              Medical Decision Making Amount and/or Complexity of Data Reviewed External Data Reviewed: labs, radiology and notes. Labs: ordered. Decision-making details documented in ED Course. Radiology: ordered and independent interpretation performed. Decision-making details documented in ED Course.  Risk OTC drugs. Prescription drug management. Parenteral controlled substances. Decision regarding hospitalization. Diagnosis or treatment significantly limited by social determinants of health.         Final Clinical Impression(s) / ED Diagnoses Final diagnoses:  Internal hernia  Ventral hernia with bowel obstruction  Generalized abdominal pain    Rx / DC Orders ED Discharge Orders     None         Noris Kulinski A, PA-C 04/28/23 2333    Tegeler, Canary Brim, MD 04/29/23 0002

## 2023-04-28 NOTE — ED Notes (Signed)
ED Provider at bedside. 

## 2023-04-28 NOTE — ED Notes (Signed)
Patient transported to CT 

## 2023-04-28 NOTE — ED Notes (Signed)
[facility] called

## 2023-06-01 ENCOUNTER — Emergency Department (HOSPITAL_BASED_OUTPATIENT_CLINIC_OR_DEPARTMENT_OTHER): Payer: Medicaid Other | Admitting: Radiology

## 2023-06-01 ENCOUNTER — Encounter (HOSPITAL_BASED_OUTPATIENT_CLINIC_OR_DEPARTMENT_OTHER): Payer: Self-pay | Admitting: Emergency Medicine

## 2023-06-01 ENCOUNTER — Emergency Department (HOSPITAL_BASED_OUTPATIENT_CLINIC_OR_DEPARTMENT_OTHER): Payer: Medicaid Other

## 2023-06-01 ENCOUNTER — Emergency Department (HOSPITAL_BASED_OUTPATIENT_CLINIC_OR_DEPARTMENT_OTHER)
Admission: EM | Admit: 2023-06-01 | Discharge: 2023-06-01 | Disposition: A | Discharge status: Home / Self Care | Payer: Medicaid Other | Attending: Emergency Medicine | Admitting: Emergency Medicine

## 2023-06-01 ENCOUNTER — Other Ambulatory Visit: Payer: Self-pay

## 2023-06-01 DIAGNOSIS — M79605 Pain in left leg: Secondary | ICD-10-CM | POA: Insufficient documentation

## 2023-06-01 DIAGNOSIS — R0789 Other chest pain: Secondary | ICD-10-CM | POA: Insufficient documentation

## 2023-06-01 DIAGNOSIS — E119 Type 2 diabetes mellitus without complications: Secondary | ICD-10-CM | POA: Insufficient documentation

## 2023-06-01 DIAGNOSIS — J45909 Unspecified asthma, uncomplicated: Secondary | ICD-10-CM | POA: Diagnosis not present

## 2023-06-01 LAB — COMPREHENSIVE METABOLIC PANEL WITH GFR
ALT: 49 U/L — ABNORMAL HIGH (ref 0–44)
AST: 27 U/L (ref 15–41)
Albumin: 4.2 g/dL (ref 3.5–5.0)
Alkaline Phosphatase: 136 U/L — ABNORMAL HIGH (ref 38–126)
Anion gap: 6 (ref 5–15)
BUN: 8 mg/dL (ref 6–20)
CO2: 27 mmol/L (ref 22–32)
Calcium: 8.8 mg/dL — ABNORMAL LOW (ref 8.9–10.3)
Chloride: 107 mmol/L (ref 98–111)
Creatinine, Ser: 0.72 mg/dL (ref 0.44–1.00)
GFR, Estimated: >60 mL/min
Glucose, Bld: 81 mg/dL (ref 70–99)
Potassium: 3.7 mmol/L (ref 3.5–5.1)
Sodium: 140 mmol/L (ref 135–145)
Total Bilirubin: 0.3 mg/dL (ref 0.3–1.2)
Total Protein: 6.2 g/dL — ABNORMAL LOW (ref 6.5–8.1)

## 2023-06-01 LAB — CBC WITH DIFFERENTIAL/PLATELET
Abs Immature Granulocytes: 0.02 10*3/uL (ref 0.00–0.07)
Basophils Absolute: 0 10*3/uL (ref 0.0–0.1)
Basophils Relative: 0 %
Eosinophils Absolute: 0.1 10*3/uL (ref 0.0–0.5)
Eosinophils Relative: 1 %
HCT: 34.7 % — ABNORMAL LOW (ref 36.0–46.0)
Hemoglobin: 10.9 g/dL — ABNORMAL LOW (ref 12.0–15.0)
Immature Granulocytes: 0 %
Lymphocytes Relative: 30 %
Lymphs Abs: 2.7 10*3/uL (ref 0.7–4.0)
MCH: 25.6 pg — ABNORMAL LOW (ref 26.0–34.0)
MCHC: 31.4 g/dL (ref 30.0–36.0)
MCV: 81.5 fL (ref 80.0–100.0)
Monocytes Absolute: 0.7 10*3/uL (ref 0.1–1.0)
Monocytes Relative: 8 %
Neutro Abs: 5.4 10*3/uL (ref 1.7–7.7)
Neutrophils Relative %: 61 %
Platelets: 184 10*3/uL (ref 150–400)
RBC: 4.26 MIL/uL (ref 3.87–5.11)
RDW: 16.5 % — ABNORMAL HIGH (ref 11.5–15.5)
WBC: 9 10*3/uL (ref 4.0–10.5)
nRBC: 0 % (ref 0.0–0.2)

## 2023-06-01 LAB — URINALYSIS, ROUTINE W REFLEX MICROSCOPIC
Bilirubin Urine: NEGATIVE
Glucose, UA: NEGATIVE mg/dL
Hgb urine dipstick: NEGATIVE
Ketones, ur: NEGATIVE mg/dL
Leukocytes,Ua: NEGATIVE
Nitrite: NEGATIVE
Specific Gravity, Urine: 1.03 (ref 1.005–1.030)
pH: 6 (ref 5.0–8.0)

## 2023-06-01 LAB — LIPASE, BLOOD: Lipase: 28 U/L (ref 11–51)

## 2023-06-01 LAB — PREGNANCY, URINE: Preg Test, Ur: NEGATIVE

## 2023-06-01 LAB — TROPONIN I (HIGH SENSITIVITY): Troponin I (High Sensitivity): <2 ng/L (ref <18)

## 2023-06-01 MED ORDER — METHOCARBAMOL 500 MG PO TABS: 500.0000 mg | ORAL_TABLET | Freq: Three times a day (TID) | ORAL | 0 refills | Status: AC | PRN

## 2023-06-01 NOTE — ED Notes (Signed)
Specimen cup at bedside. Pt will attempt to provide urine sample.

## 2023-06-01 NOTE — Discharge Instructions (Addendum)
You were seen in the emergency room today with pain.  I am starting you on a muscle relaxing medicine which can cause drowsiness and you should not drink alcohol or drive a car while taking this medicine.  Please follow close with your primary care doctor and keep your appointment the general surgery team tomorrow.  Return to the emergency department the nearest any worsening symptoms.

## 2023-06-01 NOTE — ED Provider Notes (Signed)
Emergency Department Provider Note   I have reviewed the triage vital signs and the nursing notes.   HISTORY  Chief Complaint Pain   HPI Jaime Riggs is a 42 y.o. female with PMH of DM, GERD, and asthma presents to the ED for evaluation of total left body pain.  Patient describes 2 weeks of electrical, shooting type pain extending from the top of her head through the entire left side of her body.  Symptoms are worse with movement but also come on without any clear provocation.  No midline spine tenderness, weakness, numbness.  She had some left anterior chest pain which was fleeting earlier today.  No shortness of breath.  She did have recent surgery last month in the [facility] for small bowel obstruction related to internal hernia versus volvulus.  Hernia was reduced and she was ultimately discharged.   Patient tells me she is not having any abdominal pain, nausea/vomiting, other symptoms that remind her of that diagnosis.  She has had some pain in the left leg along with some swelling/knots noted along the back of her leg since the surgery.    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   GERD (gastroesophageal reflux disease)    Migraines    Neck mass    Obesity (BMI 30-39.9) 03/27/2012   PONV (postoperative nausea and vomiting)    PTSD (post-traumatic stress disorder) 1992   from childhood experiences   Shortness of breath    with preg with multiple only   Sleep apnea     Review of Systems  Constitutional: No fever/chills Cardiovascular: Denies chest pain. Respiratory: Denies shortness of breath. Gastrointestinal: Denies abdominal pain.   Genitourinary: Negative for dysuria. Musculoskeletal: Positive left side body pain.  Skin: Negative for rash. Neurological:  Negative for headaches, focal weakness or numbness.  ____________________________________________   PHYSICAL EXAM:  VITAL SIGNS: ED Triage Vitals  Enc Vitals Group     BP 06/01/23 1847 (!) 139/100     Pulse Rate 06/01/23 1847 76     Resp 06/01/23 1847 20     Temp 06/01/23 1847 98.7 F (37.1 C)     Temp Source 06/01/23 1847 Oral     SpO2 06/01/23 1847 98 %   Constitutional: Alert and oriented. Well appearing and in no acute distress. Eyes: Conjunctivae are normal.  Head: Atraumatic. Nose: No congestion/rhinnorhea. Mouth/Throat: Mucous membranes are moist.   Neck: No stridor.   Cardiovascular: Normal rate, regular rhythm. Good peripheral circulation. Grossly normal heart sounds.   Respiratory: Normal respiratory effort.  No retractions. Lungs CTAB. Gastrointestinal: Soft and nontender. No distention.  Musculoskeletal: No lower extremity tenderness nor edema. No gross deformities of extremities.  Neurologic:  Normal speech and language. No gross focal neurologic deficits are appreciated.  Skin:  Skin is warm, dry and intact. No rash noted.   ____________________________________________   LABS (all labs ordered are listed, but only abnormal results are displayed)  Labs Reviewed  URINALYSIS, ROUTINE W REFLEX MICROSCOPIC - Abnormal; Notable for the following components:      Result Value   Protein, ur TRACE (*)    All other components within normal limits  COMPREHENSIVE METABOLIC PANEL - Abnormal; Notable for the following components:   Calcium 8.8 (*)  Total Protein 6.2 (*)    ALT 49 (*)    Alkaline Phosphatase 136 (*)    All other components within normal limits  CBC WITH DIFFERENTIAL/PLATELET - Abnormal; Notable for the following components:   Hemoglobin 10.9 (*)    HCT 34.7 (*)    MCH 25.6 (*)    RDW 16.5 (*)    All other components within normal limits  PREGNANCY, URINE  LIPASE, BLOOD  TROPONIN I (HIGH SENSITIVITY)  TROPONIN I (HIGH SENSITIVITY)    ____________________________________________  EKG   EKG Interpretation  Date/Time:  Wednesday June 01 2023 18:48:30 EDT Ventricular Rate:  77 PR Interval:  196 QRS Duration: 94 QT Interval:  371 QTC Calculation: 420 R Axis:   -19 Text Interpretation: Sinus rhythm Borderline left axis deviation Low voltage, precordial leads Confirmed by Alona Bene [phone]) on 06/01/2023 6:53:30 PM        ____________________________________________  RADIOLOGY  US Venous Img Lower  Left (DVT Study)  Result Date: 06/01/2023 CLINICAL DATA:  Pain EXAM: Left LOWER EXTREMITY VENOUS DOPPLER ULTRASOUND TECHNIQUE: Gray-scale sonography with compression, as well as color and duplex ultrasound, were performed to evaluate the deep venous system(s) from the level of the common femoral vein through the popliteal and proximal calf veins. COMPARISON:  None Available. FINDINGS: VENOUS Normal compressibility of the common femoral, superficial femoral, and popliteal veins, as well as the visualized calf veins. Visualized portions of profunda femoral vein and great saphenous vein unremarkable. No filling defects to suggest DVT on grayscale or color Doppler imaging. Doppler waveforms show normal direction of venous flow, normal respiratory plasticity and response to augmentation. Limited views of the contralateral common femoral vein are unremarkable. OTHER None. Limitations: none IMPRESSION: There is no evidence of deep venous thrombosis in left lower extremity. Electronically Signed   By: Ernie Avena M.D.   On: 06/01/2023 20:17   DG Chest 2 View  Result Date: 06/01/2023 CLINICAL DATA:  Left-sided chest pain. EXAM: CHEST - 2 VIEW COMPARISON:  April 14, 2022 FINDINGS: The heart size and mediastinal contours are within normal limits. Both lungs are clear. Radiopaque surgical clips are seen within the left upper quadrant. The visualized skeletal structures are unremarkable. IMPRESSION: No active cardiopulmonary  disease. Electronically Signed   By: Aram Candela M.D.   On: 06/01/2023 19:24    ____________________________________________   PROCEDURES  Procedure(s) performed:   Procedures  None  ____________________________________________   INITIAL IMPRESSION / ASSESSMENT AND PLAN / ED COURSE  Pertinent labs & imaging results that were available during my care of the patient were reviewed by me and considered in my medical decision making (see chart for details).   This patient is Presenting for Evaluation of leg pain, which does require a range of treatment options, and is a complaint that involves a moderate risk of morbidity and mortality.  The Differential Diagnoses include neuropathy, MSK pain, sciatica, DVT, etc.  I did obtain Additional Historical Information from husband at bedside.   I decided to review pertinent External Data, and in summary patient with ex-lap on 5/10 at [facility].    Clinical Laboratory Tests Ordered, included troponin negative.  Lipase unremarkable.  Normal bilirubin.  No UTI or pregnancy.  Radiologic Tests Ordered, included CXR and DVT US. I independently interpreted the images and agree with radiology interpretation.   Cardiac Monitor Tracing which shows NSR.    Social Determinants of Health Risk patient is a non-smoker.   Medical Decision Making: Summary:  Patient presents to the emergency  department for evaluation of mainly left leg pain but occasional shooting pains throughout the entire left side of her body.  No fevers.  Low suspicion overall for DVT/PE although did recently have surgery.  Plan for DVT ultrasound and labs.  Pain is fairly atypical for ACS but did have some more anterior chest discomfort earlier today.  EKG with no acute ischemic process.   Reevaluation with update and discussion with patient.  Workup here is overall reassuring.  Plan for Robaxin.  I discussed the drowsy side effects of this.  She will keep her follow-up appointment  with general surgery tomorrow and her PCP in the coming week.  Patient's presentation is most consistent with acute presentation with potential threat to life or bodily function.   Disposition: discharge  ____________________________________________  FINAL CLINICAL IMPRESSION(S) / ED DIAGNOSES  Final diagnoses:  Atypical chest pain  Left leg pain     NEW OUTPATIENT MEDICATIONS STARTED DURING THIS VISIT:  New Prescriptions   METHOCARBAMOL (ROBAXIN) 500 MG TABLET    Take 1 tablet (500 mg total) by mouth every 8 (eight) hours as needed for muscle spasms.    Note:  This document was prepared using Dragon voice recognition software and may include unintentional dictation errors.  Alona Bene, MD, [facility] Emergency Medicine    Elijio Staples, Arlyss Repress, MD 06/01/23 2035

## 2023-06-01 NOTE — ED Triage Notes (Signed)
Pt here from home with c/o  her whole left side hurting started in her buttox and then she noticed knots on her leg that started Sunday

## 2023-06-09 ENCOUNTER — Encounter (HOSPITAL_COMMUNITY): Payer: Self-pay

## 2023-06-09 ENCOUNTER — Emergency Department (HOSPITAL_COMMUNITY)
Admission: EM | Admit: 2023-06-09 | Discharge: 2023-06-09 | Disposition: A | Discharge status: Home / Self Care | Payer: Medicaid Other | Attending: Emergency Medicine | Admitting: Emergency Medicine

## 2023-06-09 ENCOUNTER — Other Ambulatory Visit: Payer: Self-pay

## 2023-06-09 DIAGNOSIS — G40909 Epilepsy, unspecified, not intractable, without status epilepticus: Secondary | ICD-10-CM | POA: Insufficient documentation

## 2023-06-09 DIAGNOSIS — R569 Unspecified convulsions: Secondary | ICD-10-CM

## 2023-06-09 DIAGNOSIS — Z79899 Other long term (current) drug therapy: Secondary | ICD-10-CM | POA: Diagnosis not present

## 2023-06-09 DIAGNOSIS — Z7984 Long term (current) use of oral hypoglycemic drugs: Secondary | ICD-10-CM | POA: Diagnosis not present

## 2023-06-09 DIAGNOSIS — I1A0 Resistant hypertension: Secondary | ICD-10-CM | POA: Diagnosis not present

## 2023-06-09 LAB — CBC WITH DIFFERENTIAL/PLATELET
Abs Immature Granulocytes: 0.02 10*3/uL (ref 0.00–0.07)
Basophils Absolute: 0.1 10*3/uL (ref 0.0–0.1)
Basophils Relative: 1 %
Eosinophils Absolute: 0.1 10*3/uL (ref 0.0–0.5)
Eosinophils Relative: 1 %
HCT: 36.6 % (ref 36.0–46.0)
Hemoglobin: 11.1 g/dL — ABNORMAL LOW (ref 12.0–15.0)
Immature Granulocytes: 0 %
Lymphocytes Relative: 28 %
Lymphs Abs: 2.6 10*3/uL (ref 0.7–4.0)
MCH: 25.6 pg — ABNORMAL LOW (ref 26.0–34.0)
MCHC: 30.3 g/dL (ref 30.0–36.0)
MCV: 84.5 fL (ref 80.0–100.0)
Monocytes Absolute: 0.6 10*3/uL (ref 0.1–1.0)
Monocytes Relative: 7 %
Neutro Abs: 5.7 10*3/uL (ref 1.7–7.7)
Neutrophils Relative %: 63 %
Platelets: 166 10*3/uL (ref 150–400)
RBC: 4.33 MIL/uL (ref 3.87–5.11)
RDW: 16 % — ABNORMAL HIGH (ref 11.5–15.5)
WBC: 9.1 10*3/uL (ref 4.0–10.5)
nRBC: 0 % (ref 0.0–0.2)

## 2023-06-09 LAB — I-STAT BETA HCG BLOOD, ED (MC, WL, AP ONLY): I-stat hCG, quantitative: <5 m[IU]/mL (ref <5)

## 2023-06-09 LAB — URINALYSIS, ROUTINE W REFLEX MICROSCOPIC
Bilirubin Urine: NEGATIVE
Glucose, UA: NEGATIVE mg/dL
Hgb urine dipstick: NEGATIVE
Ketones, ur: NEGATIVE mg/dL
Leukocytes,Ua: NEGATIVE
Nitrite: NEGATIVE
Protein, ur: NEGATIVE mg/dL
Specific Gravity, Urine: 1.015 (ref 1.005–1.030)
pH: 5 (ref 5.0–8.0)

## 2023-06-09 LAB — MAGNESIUM: Magnesium: 2 mg/dL (ref 1.7–2.4)

## 2023-06-09 LAB — BASIC METABOLIC PANEL
Anion gap: 8 (ref 5–15)
BUN: 7 mg/dL (ref 6–20)
CO2: 26 mmol/L (ref 22–32)
Calcium: 8.5 mg/dL — ABNORMAL LOW (ref 8.9–10.3)
Chloride: 105 mmol/L (ref 98–111)
Creatinine, Ser: 0.61 mg/dL (ref 0.44–1.00)
GFR, Estimated: >60 mL/min (ref >60)
Glucose, Bld: 82 mg/dL (ref 70–99)
Potassium: 4 mmol/L (ref 3.5–5.1)
Sodium: 139 mmol/L (ref 135–145)

## 2023-06-09 MED ORDER — SODIUM CHLORIDE 0.9 % IV BOLUS
1000.0000 mL | Freq: Once | INTRAVENOUS | Status: AC
  Administered 2023-06-09: 1000 mL via INTRAVENOUS

## 2023-06-09 NOTE — ED Triage Notes (Signed)
TRIAGE NOTE:  Patient arrives by EMS from home for c/o witnessed seizure, unknown how long episode lasted.   Patient has history of seizures but has not taken medications for unknown amount of time.  Patient arrives alert and oriented x 4, reports generalized weakness.   CBG 108

## 2023-06-09 NOTE — Discharge Instructions (Addendum)
You were seen in the emergency department for possible seizure episode.  Your blood pressure was elevated here.  Your lab work did not show any significant abnormalities.  Please follow-up with your primary care doctor for repeat blood pressure check.  You should not drive until you are cleared by your doctor and possibly neurology.  Return to the emergency department if any worsening or concerning symptoms.

## 2023-06-09 NOTE — ED Provider Notes (Signed)
[place] EMERGENCY DEPARTMENT AT [facility] Provider Note   CSN: [id number] Arrival date & time: 06/09/23  1530     History  Chief Complaint  Patient presents with   Seizures    ZAINAH STEVEN is a 42 y.o. female.  She has a prior history of seizures although she says she has not had one in 5 years and is not on any medication for it.  She said she was at home and talking to her husband, very upset about some family issues.  Then she felt like she was dreaming and singing and dancing.  Came to with her family trying to arouse her from the floor.  Possibly had a seizure.  She does not know how long she was unresponsive.  She feels fairly tired now and tingly in the middle part of her body.  Generally weak.  Headache.  She has had shaking seizures before but more typically she has what sounds more like absence seizures.  She said last week she was in the ED for chest and leg pain without clear etiology identified.  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  She denies any alcohol or drugs  The history is provided by the patient.  Seizures Seizure activity on arrival: no   Seizure type:  Unable to specify Initial focality:  Unable to specify Postictal symptoms: confusion and somnolence   Timing:  Once Progression:  Resolved Context: stress   Recent head injury:  No recent head injuries PTA treatment:  None History of seizures: yes        Home Medications Prior to Admission medications   Medication Sig Start Date End Date Taking? Authorizing Provider  acetaminophen (TYLENOL) 325 MG tablet Take 650 mg by mouth every 6 (six) hours as needed.    [provider]  albuterol (VENTOLIN HFA) 108 (90 Base) MCG/ACT inhaler Inhale 2 puffs into the lungs every 6 (six) hours as needed for wheezing or shortness of breath.  06/01/19   [provider]  ALPRAZolam Prudy Feeler) 0.5 MG tablet Take 0.25-0.5 mg by mouth 2 (two) times daily as needed for anxiety.   07/27/19   [provider]  cetirizine (ZYRTEC) 10 MG tablet Take 10 mg by mouth at bedtime.     [provider]  cyclobenzaprine (FLEXERIL) 5 MG tablet Take 1 tablet (5 mg total) by mouth at bedtime as needed for muscle spasms. Patient taking differently: Take 5 mg by mouth at bedtime. 05/30/18   Cathie Hoops, Amy V, PA-C  famotidine (PEPCID) 40 MG tablet Take 1 tablet (40 mg total) by mouth daily. 02/13/22 03/15/22  Loeffler, Finis Bud, PA-C  FEROSUL 325 (65 Fe) MG tablet Take 325 mg by mouth daily. 10/10/19   [provider]  hydrochlorothiazide (MICROZIDE) 12.5 MG capsule Take 12.5 mg by mouth daily. 07/16/21   [provider]  ibuprofen (ADVIL) 800 MG tablet Take 800 mg by mouth 3 (three) times daily as needed. 05/10/21   [provider]  lidocaine (LIDODERM) 5 % SMARTSIG:Patch(s) Topical Daily PRN 07/20/21   [provider]  metFORMIN (GLUCOPHAGE) 500 MG tablet Take 500 mg by mouth 2 (two) times daily with a meal.  08/24/19   [provider]  methocarbamol (ROBAXIN) 500 MG tablet Take 1 tablet (500 mg total) by mouth every 8 (eight) hours as needed for muscle spasms. 06/01/23   Long, Arlyss Repress, MD  Multiple Vitamins-Minerals (MULTIVITAMIN WITH MINERALS) tablet Take 1 tablet by mouth  daily.    [provider]  nabumetone (RELAFEN) 750 MG tablet Take 1 tablet (750 mg total) by mouth 2 (two) times daily as needed. 04/06/21   Kathryne Hitch, MD  oxyCODONE-acetaminophen (PERCOCET/ROXICET) 5-325 MG tablet Take 1 tablet by mouth every 6 (six) hours as needed for severe pain. 02/13/22   Loeffler, Finis Bud, PA-C  pantoprazole (PROTONIX) 20 MG tablet Take 2 tablets (40 mg total) by mouth daily. 02/13/22 03/15/22  Loeffler, Finis Bud, PA-C  predniSONE (DELTASONE) 20 MG tablet Take 3 tablets (60 mg total) by mouth daily. 04/15/22   Pollyann Savoy, MD  sertraline (ZOLOFT) 100 MG tablet Take 200 mg by mouth at bedtime. 11/13/19   [provider]   SLYND 4 MG TABS TAKE 1 TABLET BY MOUTH DAILY 10/12/22   Warden Fillers, MD  sucralfate (CARAFATE) 1 g tablet Take 1 tablet (1 g total) by mouth 4 (four) times daily -  with meals and at bedtime. 02/13/22 03/15/22  Loeffler, Finis Bud, PA-C  Vitamin D, Ergocalciferol, (DRISDOL) 1.25 MG (50000 UNIT) CAPS capsule Take 50,000 Units by mouth every 7 (seven) days. 02/08/22   [provider]      Allergies    Patient has no known allergies.    Review of Systems   Review of Systems  Constitutional:  Negative for fever.  Eyes:  Negative for visual disturbance.  Respiratory:  Negative for shortness of breath.   Cardiovascular:  Negative for chest pain.  Neurological:  Positive for seizures, weakness and numbness.    Physical Exam Updated Vital Signs BP (!) 138/91 (BP Location: Right Arm)   Pulse 74   Temp 98.9 F (37.2 C) (Oral)   Resp 18   SpO2 100%  Physical Exam Vitals and nursing note reviewed.  Constitutional:      General: She is not in acute distress.    Appearance: Normal appearance. She is well-developed.  HENT:     Head: Normocephalic and atraumatic.  Eyes:     Conjunctiva/sclera: Conjunctivae normal.  Cardiovascular:     Rate and Rhythm: Normal rate and regular rhythm.     Heart sounds: No murmur heard. Pulmonary:     Effort: Pulmonary effort is normal. No respiratory distress.     Breath sounds: Normal breath sounds.  Abdominal:     Palpations: Abdomen is soft.     Tenderness: There is no abdominal tenderness. There is no guarding or rebound.  Musculoskeletal:        General: No swelling.     Cervical back: Neck supple.  Skin:    General: Skin is warm and dry.     Capillary Refill: Capillary refill takes less than 2 seconds.  Neurological:     General: No focal deficit present.     Mental Status: She is alert and oriented to person, place, and time.     Cranial Nerves: No cranial nerve deficit.     Sensory: No sensory deficit.     Motor: No weakness.      ED Results / Procedures / Treatments   Labs (all labs ordered are listed, but only abnormal results are displayed) Labs Reviewed  BASIC METABOLIC PANEL - Abnormal; Notable for the following components:      Result Value   Calcium 8.5 (*)    All other components within normal limits  CBC WITH DIFFERENTIAL/PLATELET - Abnormal; Notable for the following components:   Hemoglobin 11.1 (*)    MCH 25.6 (*)  RDW 16.0 (*)    All other components within normal limits  URINALYSIS, ROUTINE W REFLEX MICROSCOPIC  MAGNESIUM  I-STAT BETA HCG BLOOD, ED ([facility], [facility], [facility] ONLY)    EKG EKG Interpretation  Date/Time:  Thursday June 09 2023 16:16:09 EDT Ventricular Rate:  70 PR Interval:  217 QRS Duration: 100 QT Interval:  391 QTC Calculation: 422 R Axis:   -24 Text Interpretation: Sinus rhythm Prolonged PR interval Borderline left axis deviation No significant change since prior 6/24 Confirmed by Meridee Score [phone]) on 06/09/2023 4:19:22 PM  Radiology No results found.  Procedures Procedures    Medications Ordered in ED Medications  sodium chloride 0.9 % bolus 1,000 mL (0 mLs Intravenous Stopped 06/09/23 1841)    ED Course/ Medical Decision Making/ A&P Clinical Course as of 06/10/23 1013  Thu Jun 09, 2023  1808 Patients husband is here now.  He said she was very upset and having some heavy deep breathing.  And her eyes rolled back and she started to fall off of the chair and he assisted her to the ground.  He said she was in and out of it for a few minutes while they were getting 911 to come.  Patient states is feeling much better and she feels back to baseline.  Just tired. [MB]  1901 Patient states she feels back to baseline other than a headache.  Her blood pressure is elevated here, she said she was treated for hypertension but since she lost weight she has not needed her blood pressure medicine.  I recommended that she start checking her blood pressure at home to see if she is  starting to become hypertensive again.  Close follow-up with PCP.  Return instructions discussed [MB]    Clinical Course User Index [MB] Terrilee Files, MD                             Medical Decision Making Amount and/or Complexity of Data Reviewed Labs: ordered.   This patient complains of possible seizure unresponsive episode numbness in her trunk; this involves an extensive number of treatment Options and is a complaint that carries with it a high risk of complications and morbidity. The differential includes seizure, anxiety, syncope, vasovagal  I ordered, reviewed and interpreted labs, which included CBC normal white count low hemoglobin from priors, chemistries unremarkable, urinalysis and pregnancy test negative I ordered medication IV fluids and reviewed PMP when indicated. Additional history obtained from EMS, patient's husband Previous records obtained and reviewed in epic including prior ED visits Cardiac monitoring reviewed, normal sinus rhythm Social determinants considered, no significant barriers Critical Interventions: None  After the interventions stated above, I reevaluated the patient and found patient to be awake alert neurologically intact normal gait Admission and further testing considered, no indications for admission at this time.  Recommended close follow-up with her PCP and her neurology team.  Blood pressure is elevated here and patient states she was recently taken off of blood pressure medicine after significant weight loss.  This will need to be readdressed with her PCP.  Return instructions discussed.  Patient was counseled not to drive until she is cleared by her treating team.         Final Clinical Impression(s) / ED Diagnoses Final diagnoses:  Seizure-like activity (HCC)  Resistant hypertension    Rx / DC Orders ED Discharge Orders     None  Terrilee Files, MD 06/10/23 1021

## 2023-06-09 NOTE — ED Notes (Signed)
Ambulated pt in hallway with steady gait.

## 2023-12-06 ENCOUNTER — Other Ambulatory Visit: Payer: Self-pay | Admitting: Obstetrics and Gynecology

## 2023-12-06 DIAGNOSIS — Z3009 Encounter for other general counseling and advice on contraception: Secondary | ICD-10-CM

## 2024-02-16 ENCOUNTER — Encounter (HOSPITAL_BASED_OUTPATIENT_CLINIC_OR_DEPARTMENT_OTHER): Payer: Self-pay

## 2024-02-16 ENCOUNTER — Encounter (HOSPITAL_COMMUNITY): Payer: Self-pay

## 2024-02-16 ENCOUNTER — Other Ambulatory Visit: Payer: Self-pay

## 2024-02-16 ENCOUNTER — Ambulatory Visit (HOSPITAL_COMMUNITY)
Admission: EM | Admit: 2024-02-16 | Discharge: 2024-02-16 | Disposition: A | Discharge status: Home / Self Care | Payer: Medicaid Other

## 2024-02-16 DIAGNOSIS — R109 Unspecified abdominal pain: Secondary | ICD-10-CM | POA: Diagnosis not present

## 2024-02-16 DIAGNOSIS — M546 Pain in thoracic spine: Secondary | ICD-10-CM | POA: Diagnosis present

## 2024-02-16 DIAGNOSIS — Z5321 Procedure and treatment not carried out due to patient leaving prior to being seen by health care provider: Secondary | ICD-10-CM | POA: Insufficient documentation

## 2024-02-16 NOTE — ED Triage Notes (Signed)
 Pt reports she is here today due to mid back pain x 1 week. Pt reports that she also started having abd pain since today. Pt reports she has h/o bowel obstruction. Pt denies any N&V&D.

## 2024-02-16 NOTE — ED Provider Notes (Signed)
 Patient was here with her daughter who is also being seen.  Patient asked that her daughter be seen first and during my exam I expressed concern for potential acute abdomen for her daughter.  Patient left after triage but before her evaluation could take place so that she can take her daughter to the emergency room for further evaluation and management. Physical exam, evaluation and management plan were not completed or discussed for patient's complaints.   Roselind Messier 02/16/24 2058

## 2024-02-16 NOTE — ED Triage Notes (Signed)
 Patient c/o mid back pain x 1 week. Patient denies any heavy lifting or injury.  Patient reports that she has been using Lidocaine patches and tylenol at 0800.

## 2024-02-17 ENCOUNTER — Emergency Department (HOSPITAL_BASED_OUTPATIENT_CLINIC_OR_DEPARTMENT_OTHER)
Admission: EM | Admit: 2024-02-17 | Discharge: 2024-02-17 | Discharge status: Against Medical Advice | Payer: Medicaid Other | Attending: Emergency Medicine | Admitting: Emergency Medicine

## 2024-08-10 ENCOUNTER — Institutional Professional Consult (permissible substitution)

## 2024-08-23 ENCOUNTER — Institutional Professional Consult (permissible substitution)

## 2024-08-23 ENCOUNTER — Emergency Department (HOSPITAL_BASED_OUTPATIENT_CLINIC_OR_DEPARTMENT_OTHER)
Admission: EM | Admit: 2024-08-23 | Discharge: 2024-08-23 | Discharge status: Against Medical Advice | Attending: Emergency Medicine | Admitting: Emergency Medicine

## 2024-08-23 ENCOUNTER — Other Ambulatory Visit: Payer: Self-pay

## 2024-08-23 DIAGNOSIS — R519 Headache, unspecified: Secondary | ICD-10-CM | POA: Diagnosis present

## 2024-08-23 DIAGNOSIS — Z5321 Procedure and treatment not carried out due to patient leaving prior to being seen by health care provider: Secondary | ICD-10-CM | POA: Insufficient documentation

## 2024-08-23 NOTE — ED Triage Notes (Signed)
 Patient states headache for several days. Also concerned about swollen glands in neck. States she is epileptic and thinks she had several seizures recently. Does not take seizure medication.

## 2024-08-29 ENCOUNTER — Ambulatory Visit

## 2024-08-29 VITALS — BP 130/84 | HR 87 | Ht 67.0 in | Wt 242.0 lb

## 2024-08-29 DIAGNOSIS — R21 Rash and other nonspecific skin eruption: Secondary | ICD-10-CM

## 2024-08-29 DIAGNOSIS — Z803 Family history of malignant neoplasm of breast: Secondary | ICD-10-CM

## 2024-08-29 DIAGNOSIS — N62 Hypertrophy of breast: Secondary | ICD-10-CM

## 2024-08-29 DIAGNOSIS — M542 Cervicalgia: Secondary | ICD-10-CM

## 2024-08-29 DIAGNOSIS — M549 Dorsalgia, unspecified: Secondary | ICD-10-CM

## 2024-08-29 DIAGNOSIS — Z1231 Encounter for screening mammogram for malignant neoplasm of breast: Secondary | ICD-10-CM

## 2024-08-29 NOTE — Progress Notes (Signed)
 BREAST REDUCTION CONSULT Plastic & Reconstructive Surgery New Patient Visit  Patient: Jaime Riggs MRN: [id number] Date: 08/29/2024  Chief Complaint: Symptomatic macromastia, cervicalgia  History of Present Illness:  This is a 43 y.o. woman with PMH and PSH as described below who presents in consultation for breast reduction.   The patient states she has been considering a breast reduction for years. She describes intermittent skin irritation in the breast folds and occasional rashes.   Back pain in the upper and lower back, including neck pain. She pulls or pins her bra straps to provide better lift and relief of the pressure and pain. She notices relief by holding her breast up manually.  Her shoulder straps cause grooves and pain and pressure that requires padding for relief. Pain medication is sometimes required with motrin  and tylenol .  Activities that are hindered by enlarged breasts include: exercise and running.  She has tried supportive clothing as well as fitted bras without improvement.  She currently wears a GGG cup bra and would ideally like to be a B cup.   She has no personal history of breast abnormalities and has never had any breast biopsies or surgeries.  Her last mammogram was 2 years ago, normal.  She no recent weight changes. Of note, she is done child bearing.   Past Medical History: Past Medical History:  Diagnosis Date   Asthma    Diabetes mellitus without complication (HCC)    Difficult intubation 12/06/2019   TMJ; small mouth; limited neck extension; redundant soft tissue   Epilepsy (HCC)    last episode  two yrs.   GERD (gastroesophageal reflux disease)    Migraines    Neck mass    Obesity (BMI 30-39.9) 03/27/2012   PONV (postoperative nausea and vomiting)    PTSD (post-traumatic stress disorder) 1992   from childhood experiences   Shortness of breath    with preg with multiple only   Sleep apnea    Past Surgical History: Past Surgical History:   Procedure Laterality Date   CARPAL TUNNEL RELEASE Left 12/06/2019   Procedure: CARPAL TUNNEL RELEASE-LEFT;  Surgeon: Gillie Duncans, MD;  Location: [facility] OR;  Service: Neurosurgery;  Laterality: Left;  CARPAL TUNNEL RELEASE-LEFT   CESAREAN SECTION     CESAREAN SECTION  12/04/2012   Procedure: CESAREAN SECTION;  Surgeon: Nathanel LELON Bunker, MD;  Location: [facility];  Service: Obstetrics;  Laterality: N/A;  repeat c/s-TWINS   LAPAROSCOPIC GASTRIC BAND REMOVAL WITH LAPAROSCOPIC GASTRIC SLEEVE RESECTION  2021   MOUTH SURGERY     NECK MASS EXCISION  04/04/2012   pathology c/w lipoma   Current Medications: Current Outpatient Medications on File Prior to Visit  Medication Sig Dispense Refill   albuterol  (VENTOLIN  HFA) 108 (90 Base) MCG/ACT inhaler Inhale 2 puffs into the lungs every 6 (six) hours as needed for wheezing or shortness of breath.      ALPRAZolam (XANAX) 0.5 MG tablet Take 0.25-0.5 mg by mouth 2 (two) times daily as needed for anxiety.      cetirizine (ZYRTEC) 10 MG tablet Take 10 mg by mouth at bedtime.      cyclobenzaprine  (FLEXERIL ) 5 MG tablet Take 1 tablet (5 mg total) by mouth at bedtime as needed for muscle spasms. (Patient taking differently: Take 5 mg by mouth at bedtime.) 10 tablet 0   hydrochlorothiazide (MICROZIDE) 12.5 MG capsule Take 12.5 mg by mouth daily.     ibuprofen  (ADVIL ) 800 MG tablet Take 800 mg by mouth 3 (three) times  daily as needed.     lidocaine  (LIDODERM ) 5 % SMARTSIG:Patch(s) Topical Daily PRN     methocarbamol  (ROBAXIN ) 500 MG tablet Take 1 tablet (500 mg total) by mouth every 8 (eight) hours as needed for muscle spasms. 20 tablet 0   Multiple Vitamins-Minerals (MULTIVITAMIN WITH MINERALS) tablet Take 1 tablet by mouth daily.     nabumetone  (RELAFEN ) 750 MG tablet Take 1 tablet (750 mg total) by mouth 2 (two) times daily as needed. 60 tablet 3   oxyCODONE -acetaminophen  (PERCOCET/ROXICET) 5-325 MG tablet Take 1 tablet by mouth every 6 (six) hours as needed for  severe pain. 15 tablet 0   predniSONE  (DELTASONE ) 20 MG tablet Take 3 tablets (60 mg total) by mouth daily. 15 tablet 0   sertraline (ZOLOFT) 100 MG tablet Take 200 mg by mouth at bedtime.     SLYND  4 MG TABS TAKE 1 TABLET BY MOUTH DAILY 28 tablet 9   Vitamin D, Ergocalciferol, (DRISDOL) 1.25 MG (50000 UNIT) CAPS capsule Take 50,000 Units by mouth every 7 (seven) days.     famotidine  (PEPCID ) 40 MG tablet Take 1 tablet (40 mg total) by mouth daily. 30 tablet 0   pantoprazole  (PROTONIX ) 20 MG tablet Take 2 tablets (40 mg total) by mouth daily. 60 tablet 0   sucralfate  (CARAFATE ) 1 g tablet Take 1 tablet (1 g total) by mouth 4 (four) times daily -  with meals and at bedtime. 120 tablet 0   No current facility-administered medications on file prior to visit.   Allergies: No Known Allergies  Family History:  History of breast cancer in maternal aunt and paternal aunts x2. Otherwise, family history is negative for bleeding/clotting disorders, problems with anesthesia, connective tissue disorders.   Social History:  Social History   Socioeconomic History   Marital status: Married    Spouse name: Not on file   Number of children: Not on file   Years of education: Not on file   Highest education level: Not on file  Occupational History   Not on file  Tobacco Use   Smoking status: Never   Smokeless tobacco: Never  Vaping Use   Vaping status: Never Used  Substance and Sexual Activity   Alcohol use: Not Currently    Comment: occasional wine   Drug use: No   Sexual activity: Yes    Birth control/protection: Pill  Other Topics Concern   Not on file  Social History Narrative   Not on file   Social Drivers of Health   Financial Resource Strain: Not on file  Food Insecurity: Low Risk  (04/29/2023)   Received from [facility]   Hunger Vital Sign    Within the past 12 months, you worried that your food would run out before you got money to buy more: Never true    Within the past 12  months, the food you bought just didn't last and you didn't have money to get more. : Never true  Transportation Needs: No Transportation Needs (04/29/2023)   Received from [facility]    In the past 12 months, has lack of reliable transportation kept you from medical appointments, meetings, work or from getting things needed for daily living? : No  Physical Activity: Not on file  Stress: Not on file  Social Connections: Unknown (04/19/2022)   Received from [facility]   Social Network    Social Network: Not on file   Smoker: Denies Recreational drug use: Denies  Review of systems:  10 point review of systems performed and negative except as noted in the HPI  Physical Exam: BP 130/84 (BP Location: Left Arm, Patient Position: Sitting, Cuff Size: Large)   Pulse 87   Ht 5' 7 (1.702 m)   Wt 242 lb (109.8 kg)   SpO2 96%   BMI 37.90 kg/m  Body mass index is 37.9 kg/m.  Physical Exam           General: Well appearing, no apparent distress. Chest: Chest wall without abnormality or obvious deformity. No scoliosis, pectus excavatum or pectus carinatum. Breast: Bilateral macromastia. Grade 3 ptosis bilaterally. Breast parenchyma is fatty. There are no palpable breast masses in either breast. Bilateral NAC viable and sensate. Papules everted without discharge. NACs positioned along breast meridian bilaterally. Skin quality is poor. There are no skin lesions, scars or dimpling.  - Breast measurements:  Measurements  Right Breast (cm)  Left Breast (cm)  SN-Nipple 37 38  Base Width 16 17  Nipple - IMF 14 15  NAC diameter 7*8 8*8  Internipple Distance 20  Neuro: Moving all four extremities spontaneously.  Psych: Appropriate mood and affect.   Pertinent Labs: No results found for this or any previous visit (from the past 2160 hours).  Pertinent Imaging:  Assessment: In summary, this is a pleasant 43 y.o. year-old woman presenting for consultation for bilateral  breast reduction in setting of symptomatic macromastia.   After evaluation today, I believe the patient would be an appropriate candidate for the operation. Based on her Schnur scale, she would require a 630 g reduction per side. I think a 630 g resection would be attainable through a reduction mammoplasty. We discussed the operation in detail including the potential incision patterns (e.g., vertical only versus wise pattern), and possible need for surgical drains. Based on her clinical exam, she will likely require a wise pattern with inferior pedicle.   We discussed the risks of this procedure which include but are not limited to: bleeding, infection, seroma, delayed wound healing, wound dehiscence, asymmetries, fat necrosis, hypertrophic and keloid scarring, decreased or loss of nipple sensation, partial or full loss of the NAC, numbness, paresthesias, injuries to arteries/nerves/veins, need for revision procedures, further out-of-pocket expenses for ongoing medical care, and potential need for repeat reduction in the future. We discussed that pregnancy can alter the size and shape of her breasts and revision procedures may be required after pregnancy. We discussed that while she has the same potential to breast feed as a woman who has never had a breast reduction, she may have less milk production and may require formula supplementation. We further discussed the risk of DVT/PE, fat embolism, heart attack, stroke, death as well as the risks of anesthesia. We reviewed the expected recovery period with no heavy activities or lifting >5lbs for 6 weeks postoperatively.   The patient voiced understanding and wishes to proceed. All questions and concerns were addressed to the patient's apparent satisfaction.   Plan: - Plan for bilateral reduction mammaplasty with liposuction - 4 hours under general anesthesia. - Will submit for pre-determination with insurance. - Scheduling pending insurance. - Needs a  mammogram before surgery is schedule.   The sensitive parts of the examination/procedure were performed with MA as chaperone.  The time documented represents the total time spent on the day of the encounter in preparing for and completing the visit. It does not include time spent by ancillary staff, a resident, a fellow, another trainee, or, for shared visits, time spent jointly with  the patient or discussing the case or the performance of other separately performed services.  Time spent: 45 minutes.     Charlen Bakula, MD [facility]  08/29/2024 3:52 PM

## 2024-09-06 ENCOUNTER — Ambulatory Visit
Admission: RE | Admit: 2024-09-06 | Discharge: 2024-09-06 | Disposition: A | Discharge status: Home / Self Care | Source: Ambulatory Visit

## 2024-09-06 DIAGNOSIS — N62 Hypertrophy of breast: Secondary | ICD-10-CM

## 2024-09-06 DIAGNOSIS — Z1231 Encounter for screening mammogram for malignant neoplasm of breast: Secondary | ICD-10-CM

## 2024-09-17 ENCOUNTER — Ambulatory Visit: Payer: Self-pay

## 2024-10-10 ENCOUNTER — Ambulatory Visit: Admitting: Primary Care

## 2024-10-10 ENCOUNTER — Ambulatory Visit (INDEPENDENT_AMBULATORY_CARE_PROVIDER_SITE_OTHER): Admitting: Primary Care

## 2024-10-10 ENCOUNTER — Encounter: Payer: Self-pay | Admitting: Primary Care

## 2024-10-10 VITALS — BP 124/82 | HR 86 | Temp 97.8°F | Ht 68.0 in | Wt 240.2 lb

## 2024-10-10 DIAGNOSIS — F419 Anxiety disorder, unspecified: Secondary | ICD-10-CM | POA: Diagnosis not present

## 2024-10-10 DIAGNOSIS — Z9884 Bariatric surgery status: Secondary | ICD-10-CM

## 2024-10-10 DIAGNOSIS — G4733 Obstructive sleep apnea (adult) (pediatric): Secondary | ICD-10-CM | POA: Diagnosis not present

## 2024-10-10 DIAGNOSIS — Z6841 Body Mass Index (BMI) 40.0 and over, adult: Secondary | ICD-10-CM

## 2024-10-10 DIAGNOSIS — G4763 Sleep related bruxism: Secondary | ICD-10-CM | POA: Diagnosis not present

## 2024-10-10 DIAGNOSIS — G4730 Sleep apnea, unspecified: Secondary | ICD-10-CM

## 2024-10-10 DIAGNOSIS — Z23 Encounter for immunization: Secondary | ICD-10-CM | POA: Diagnosis not present

## 2024-10-10 DIAGNOSIS — E66813 Obesity, class 3: Secondary | ICD-10-CM | POA: Diagnosis not present

## 2024-10-10 DIAGNOSIS — Z6836 Body mass index (BMI) 36.0-36.9, adult: Secondary | ICD-10-CM | POA: Diagnosis not present

## 2024-10-10 DIAGNOSIS — Z8669 Personal history of other diseases of the nervous system and sense organs: Secondary | ICD-10-CM

## 2024-10-10 NOTE — Patient Instructions (Signed)
  VISIT SUMMARY: Today, you were seen for a sleep consultation to evaluate your sleep apnea and discuss potential alternative treatments. You have a history of sleep apnea and have been using a CPAP machine, which you find uncomfortable and anxiety-inducing. You have experienced significant weight loss and are interested in exploring other treatment options such as Inspire therapy. We discussed your symptoms, including excessive daytime sleepiness and occasional gasping during sleep, and reviewed your history of bruxism and anxiety.  YOUR PLAN: -OBSTRUCTIVE SLEEP APNEA: Obstructive sleep apnea is a condition where your airway becomes blocked during sleep, causing breathing pauses. Given your significant weight loss, we will reassess the severity of your sleep apnea with an in-lab sleep study. Depending on the results, we may consider Inspire therapy, oral appliance therapy, or weight loss medications like Zepbound. We will also check if Medicaid covers Inspire therapy.  -OBESITY STATUS POST BARIATRIC SURGERY: You have successfully lost a significant amount of weight following bariatric surgery but have reached a plateau. We discussed the potential use of Zepbound for further weight loss, depending on the results of your sleep study and Medicaid coverage. Continue your efforts towards further weight loss.  -SLEEP RELATED BRUXISM: Bruxism is a condition where you grind or clench your teeth during sleep. We discussed the potential use of an oral appliance to address both your bruxism and sleep apnea, pending the results of your sleep study. Coordinate with your dentist regarding this option and consider delaying Invisalign until after the sleep study results.  -ANXIETY DISORDER: Anxiety is a feeling of worry or fear that can interfere with daily activities. Your anxiety is contributing to your difficulty using the CPAP machine. To help with this, you can take 1 mg of Xanax before your upcoming sleep study and  inform the sleep technician.  INSTRUCTIONS: Please schedule an in-lab sleep study to reassess the severity of your sleep apnea. Discuss with Medicaid about the coverage for Inspire therapy and Zepbound. Coordinate with your dentist regarding the potential use of an oral appliance for bruxism and sleep apnea, and consider delaying Invisalign until after the sleep study results. Take 1 mg of Xanax before your sleep study and inform the sleep technician.  Orders: Polysomnography   Follow-up 2-3 months with Landry NP

## 2024-10-10 NOTE — Progress Notes (Signed)
 @Patient  ID: Jaime Riggs, female    DOB: 1981/05/06, 43 y.o.   MRN: [id number]  Chief Complaint  Patient presents with   Consult    Previously dx with OSA X15-16 YEARS. Last tested at [facility] sleep center. Still uses a cpap but does not like it. DME is [employer].     Referring provider: Carlie Clark, MD  HPI: 43 year old female, never smoked. PMH significant for OSA, HTN, type 2 diabetes, RA, obesity, PTSD, panic attacks.  [facility] weight management center - Notes reviewed  Date of Surgery: 04/29/2023 (internal hernia & Incisional Hernia); 05/25/2022 (DS); 04/15/2020 (Sleeve) Type of Surgery: DS Initial Weight: 358 (Pre sleeve); 325 (pre DS) Last Weight: 230 (06/02/2023) Today's Weight: 235 Weight Loss Since Last Visit: +5 Total Weight Loss to Date: 123   Patient Active Problem List  Diagnosis Date Noted  Class 3 severe obesity due to excess calories with serious comorbidity and body mass index (BMI) of 50.0 to 59.9 in adult [facility]) 05/25/2022    10/10/2024- Sleep consult, Hx OSA intolerant to CPAP  Discussed the use of AI scribe software for clinical note transcription with the patient, who gave verbal consent to proceed.  History of Present Illness Jaime Riggs is a 43 year old female who presents for a sleep consult. She was referred by Dr. Carlie for evaluation of her sleep apnea and potential consideration of Inspire therapy.  She has a history of sleep apnea and has been using a CPAP machine, which she finds uncomfortable and anxiety-inducing. Despite regular cleaning, she feels the machine is dirty and has replaced it once. She has not had a sleep study in approximately ten years and is interested in exploring alternative treatments such as Inspire therapy. She has experienced significant weight loss, losing between 125 and 150 pounds, achieved through weight loss surgery and increased physical activity. She is uncertain if she still requires treatment for  sleep apnea given her weight loss and has not consistently used her CPAP machine.  Persistent symptoms of sleep apnea include excessive daytime sleepiness, frequent yawning, and falling asleep during periods of inactivity. She occasionally wakes up gasping or choking, even when using the CPAP machine. She notes that she no longer wakes up frequently to urinate at night, which she attributes to her weight loss.  She has a history of bruxism and is considering an oral appliance to address both her sleep apnea and teeth grinding. She is also in the process of getting braces and has started the process of getting Invisalign to help with her teeth grinding.  Her sleep pattern includes going to bed between 11 PM and 1 AM, taking up to two hours to fall asleep, waking up hourly, and starting her day around 11 AM. She has a history of severe sleep apnea, which she believes was initially weight-induced during her pregnancies.  No current snoring but a history of snoring that may have improved with weight loss. She confirms waking up gasping or choking and excessive daytime sleepiness.  No concern for narcolepsy or cataplexy. [place] 9/24    No Known Allergies  Immunization History  Administered Date(s) Administered   PFIZER(Purple Top)SARS-COV-2 Vaccination 03/06/2020, 04/01/2020    Past Medical History:  Diagnosis Date   Asthma    Diabetes mellitus without complication (HCC)    Difficult intubation 12/06/2019   TMJ; small mouth; limited neck extension; redundant soft tissue   Epilepsy (HCC)    last episode  two yrs.  GERD (gastroesophageal reflux disease)    Migraines    Neck mass    Obesity (BMI 30-39.9) 03/27/2012   PONV (postoperative nausea and vomiting)    PTSD (post-traumatic stress disorder) 1992   from childhood experiences   Shortness of breath    with preg with multiple only   Sleep apnea     Tobacco History: Social History   Tobacco Use  Smoking Status Never  Smokeless  Tobacco Never   Counseling given: Not Answered   Outpatient Medications Prior to Visit  Medication Sig Dispense Refill   albuterol  (VENTOLIN  HFA) 108 (90 Base) MCG/ACT inhaler Inhale 2 puffs into the lungs every 6 (six) hours as needed for wheezing or shortness of breath.      ALPRAZolam (XANAX) 0.5 MG tablet Take 0.25-0.5 mg by mouth 2 (two) times daily as needed for anxiety.      cetirizine (ZYRTEC) 10 MG tablet Take 10 mg by mouth at bedtime.      cyclobenzaprine  (FLEXERIL ) 5 MG tablet Take 1 tablet (5 mg total) by mouth at bedtime as needed for muscle spasms. (Patient taking differently: Take 5 mg by mouth at bedtime.) 10 tablet 0   famotidine  (PEPCID ) 40 MG tablet Take 1 tablet (40 mg total) by mouth daily. 30 tablet 0   hydrochlorothiazide (MICROZIDE) 12.5 MG capsule Take 12.5 mg by mouth daily.     ibuprofen  (ADVIL ) 800 MG tablet Take 800 mg by mouth 3 (three) times daily as needed.     lidocaine  (LIDODERM ) 5 % SMARTSIG:Patch(s) Topical Daily PRN     methocarbamol  (ROBAXIN ) 500 MG tablet Take 1 tablet (500 mg total) by mouth every 8 (eight) hours as needed for muscle spasms. 20 tablet 0   Multiple Vitamins-Minerals (MULTIVITAMIN WITH MINERALS) tablet Take 1 tablet by mouth daily.     nabumetone  (RELAFEN ) 750 MG tablet Take 1 tablet (750 mg total) by mouth 2 (two) times daily as needed. 60 tablet 3   oxyCODONE -acetaminophen  (PERCOCET/ROXICET) 5-325 MG tablet Take 1 tablet by mouth every 6 (six) hours as needed for severe pain. 15 tablet 0   pantoprazole  (PROTONIX ) 20 MG tablet Take 2 tablets (40 mg total) by mouth daily. 60 tablet 0   predniSONE  (DELTASONE ) 20 MG tablet Take 3 tablets (60 mg total) by mouth daily. (Patient not taking: Reported on 10/10/2024) 15 tablet 0   sertraline (ZOLOFT) 100 MG tablet Take 200 mg by mouth at bedtime.     SLYND  4 MG TABS TAKE 1 TABLET BY MOUTH DAILY 28 tablet 9   sucralfate  (CARAFATE ) 1 g tablet Take 1 tablet (1 g total) by mouth 4 (four) times daily  -  with meals and at bedtime. 120 tablet 0   Vitamin D, Ergocalciferol, (DRISDOL) 1.25 MG (50000 UNIT) CAPS capsule Take 50,000 Units by mouth every 7 (seven) days.     No facility-administered medications prior to visit.      Review of Systems  Review of Systems  Constitutional:  Positive for fatigue.  Respiratory: Negative.    Psychiatric/Behavioral:  Positive for sleep disturbance.    Physical Exam  BP 124/82   Pulse 86   Temp 97.8 F (36.6 C)   Ht 5' 8 (1.727 m)   Wt 240 lb 3.2 oz (109 kg)   SpO2 98% Comment: ra  BMI 36.52 kg/m  Physical Exam Constitutional:      Appearance: Normal appearance. She is well-developed.  HENT:     Head: Normocephalic and atraumatic.     Mouth/Throat:  Mouth: Mucous membranes are moist.     Pharynx: Oropharynx is clear.  Eyes:     Pupils: Pupils are equal, round, and reactive to light.  Cardiovascular:     Rate and Rhythm: Normal rate and regular rhythm.     Heart sounds: Normal heart sounds. No murmur heard. Pulmonary:     Effort: Pulmonary effort is normal. No respiratory distress.     Breath sounds: Normal breath sounds. No wheezing or rhonchi.  Musculoskeletal:        General: Normal range of motion.     Cervical back: Normal range of motion and neck supple.  Skin:    General: Skin is warm and dry.     Findings: No erythema or rash.  Neurological:     General: No focal deficit present.     Mental Status: She is alert and oriented to person, place, and time. Mental status is at baseline.  Psychiatric:        Mood and Affect: Mood normal.        Behavior: Behavior normal.        Thought Content: Thought content normal.        Judgment: Judgment normal.     Lab Results:  CBC    Component Value Date/Time   WBC 9.1 06/09/2023 1638   RBC 4.33 06/09/2023 1638   HGB 11.1 (L) 06/09/2023 1638   HCT 36.6 06/09/2023 1638   PLT 166 06/09/2023 1638   MCV 84.5 06/09/2023 1638   MCH 25.6 (L) 06/09/2023 1638   MCHC 30.3  06/09/2023 1638   RDW 16.0 (H) 06/09/2023 1638   LYMPHSABS 2.6 06/09/2023 1638   MONOABS 0.6 06/09/2023 1638   EOSABS 0.1 06/09/2023 1638   BASOSABS 0.1 06/09/2023 1638    BMET    Component Value Date/Time   NA 139 06/09/2023 1638   K 4.0 06/09/2023 1638   CL 105 06/09/2023 1638   CO2 26 06/09/2023 1638   GLUCOSE 82 06/09/2023 1638   BUN 7 06/09/2023 1638   CREATININE 0.61 06/09/2023 1638   CALCIUM 8.5 (L) 06/09/2023 1638   GFRNONAA >60 06/09/2023 1638   GFRAA >60 12/06/2019 1426    BNP No results found for: BNP  ProBNP No results found for: PROBNP  Imaging: No results found.   Assessment & Plan:   1. Hx of sleep apnea (Primary) - Polysomnography 4 or more parameters (NPSG); Future  2. Obesity, Class III, BMI 40-49.9 (morbid obesity) (HCC) - Flu vaccine trivalent PF, 6mos and older(Flulaval,Afluria,Fluarix,Fluzone) - Polysomnography 4 or more parameters (NPSG); Future   Assessment and Plan Assessment & Plan Obstructive sleep apnea evaluation and management Severe obstructive sleep apnea with CPAP intolerance due to anxiety and discomfort. Significant weight loss of 125-150 pounds since last sleep study 10 years ago. Potential improvement in OSA severity due to weight loss. Symptoms include daytime sleepiness, occasional gasping, and reduced nocturnal awakenings. No current snoring reported. Considering Inspire therapy, but unsure of Medicaid coverage. Discussed possibility of mild OSA, which may not require CPAP or Inspire. Discussed alternative treatments such as oral appliances and weight loss medications like Zepbound, contingent on sleep study results. - Order in-lab sleep study to reassess OSA severity. - Discuss potential for Inspire therapy with Medicaid coverage. - Consider oral appliance therapy if OSA is mild or moderate. - Explore Zepbound for weight loss if sleep study indicates moderate or severe OSA.  Obesity status post bariatric surgery Status  post bariatric surgery 04/29/2023 with significant weight loss and  current weight loss plateau. Initial Weight: 358lbs. Current weight 240lbs. Previous use of GLP-1 agonist injections for weight loss, but coverage issues with Medicaid. Discussed potential use of Zepbound for further weight loss, contingent on sleep study results and Medicaid coverage. - Discuss Zepbound coverage with Medicaid for obesity and OSA. Consider prescribing after getting updated sleep study.  - Encourage further weight loss efforts.  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 Discussed potential use of oral appliance for both bruxism and OSA, pending sleep study results. - Coordinate with dentist regarding oral appliance for bruxism and OSA. - Consider delaying Invisalign until after sleep study results.  Anxiety disorder Anxiety exacerbated by CPAP use, contributing to CPAP intolerance. Discussed use of Xanax to aid sleep during upcoming sleep study. - Advise ok taking 1 mg Xanax before sleep study and inform sleep technician.  I personally spent a total of 30 minutes in the care of the patient today including performing a medically appropriate exam/evaluation, counseling and educating, placing orders, documenting clinical information in the EHR, and coordinating care.   Almarie LELON Ferrari, NP 10/10/2024

## 2024-10-17 ENCOUNTER — Telehealth: Payer: Self-pay | Admitting: *Deleted

## 2024-10-17 NOTE — Telephone Encounter (Signed)
 Copied from CRM [id number]. Topic: Clinical - Request for Lab/Test Order >> Oct 17, 2024  8:04 AM Jaime Riggs wrote: Reason for CRM:   Jaime Riggs, with [facility] Medicaid Pre Cert Dept, is contacting clinic to speak with Jaime Riggs regarding a PA for in lab sleep study.   Pt does not have any known co morbidities that would prevent her from having a in home sleep study, and is recommended over an in lab sleep study. Jaime Riggs would like to know if she can be redirected to a in home sleep study. If not she can pass the order along to the Medical Director to be processed.   Can respond by fax, due to no direct phone number  FX#  [phone], make sure pt's info on front page, with case # [id number]  -Needs response by tomm at 10:30 AM  Forwarding to [facility] Riggs as she does the PA and to make [facility] aware.

## 2024-10-18 NOTE — Addendum Note (Signed)
 Addended by: HOPE ALMARIE ORN on: 10/18/2024 09:09 AM   Modules accepted: Orders

## 2024-10-18 NOTE — Telephone Encounter (Signed)
 I didn't want her to do a home study because she has a hx severe sleep apnea. I will change to split night and see if this will get covered. If not we can do HST

## 2024-10-25 NOTE — Telephone Encounter (Signed)
 Scheduling can only book one or the other. Please advise.

## 2024-10-25 NOTE — Telephone Encounter (Signed)
 Nope.

## 2024-10-25 NOTE — Telephone Encounter (Signed)
 Pt is scheduled for NPSG on 12/29. Should we change that order to the split night study? Please advise

## 2024-10-25 NOTE — Telephone Encounter (Signed)
 I just want PSG

## 2024-10-29 NOTE — Telephone Encounter (Signed)
 Thanks

## 2024-10-29 NOTE — Telephone Encounter (Signed)
 Sent to scheduling for split night, cancelling PSG because that ordered was denied by insurance.

## 2024-10-31 NOTE — Telephone Encounter (Signed)
 Pt is scheduled NFN

## 2024-12-07 ENCOUNTER — Telehealth: Payer: Self-pay

## 2024-12-07 NOTE — Telephone Encounter (Signed)
 Attempted to call patient to reschedule,unable to leave voice message,will send mychart and call again later

## 2024-12-07 NOTE — Telephone Encounter (Signed)
 I have attempted to call the patient again to reschedule appointment for Monday,unable to leave message

## 2024-12-09 NOTE — Progress Notes (Deleted)
 "  @Patient  ID: Jaime Riggs, female    DOB: 04-12-1981, 43 y.o.   MRN: [id number]  No chief complaint on file.   Referring provider: Shelda Atlas, MD  HPI:  43 year old female, never smoked. PMH significant for OSA, HTN, type 2 diabetes, RA, obesity, PTSD, panic attacks.  [facility] weight management center - Notes reviewed  Date of Surgery: 04/29/2023 (internal hernia & Incisional Hernia); 05/25/2022 (DS); 04/15/2020 (Sleeve) Type of Surgery: DS Initial Weight: 358 (Pre sleeve); 325 (pre DS) Last Weight: 230 (06/02/2023) Todays Weight: 235 Weight Loss Since Last Visit: +5 Total Weight Loss to Date: 123   Patient Active Problem List  Diagnosis Date Noted  Class 3 severe obesity due to excess calories with serious comorbidity and body mass index (BMI) of 50.0 to 59.9 in adult [facility]) 05/25/2022    10/10/2024- Sleep consult, Hx OSA intolerant to CPAP  Discussed the use of AI scribe software for clinical note transcription with the patient, who gave verbal consent to proceed.  History of Present Illness Jaime Riggs is a 43 year old female who presents for a sleep consult. She was referred by Dr. Carlie for evaluation of her sleep apnea and potential consideration of Inspire therapy.  She has a history of sleep apnea and has been using a CPAP machine, which she finds uncomfortable and anxiety-inducing. Despite regular cleaning, she feels the machine is dirty and has replaced it once. She has not had a sleep study in approximately ten years and is interested in exploring alternative treatments such as Inspire therapy. She has experienced significant weight loss, losing between 125 and 150 pounds, achieved through weight loss surgery and increased physical activity. She is uncertain if she still requires treatment for sleep apnea given her weight loss and has not consistently used her CPAP machine.  Persistent symptoms of sleep apnea include excessive daytime  sleepiness, frequent yawning, and falling asleep during periods of inactivity. She occasionally wakes up gasping or choking, even when using the CPAP machine. She notes that she no longer wakes up frequently to urinate at night, which she attributes to her weight loss.  She has a history of bruxism and is considering an oral appliance to address both her sleep apnea and teeth grinding. She is also in the process of getting braces and has started the process of getting Invisalign to help with her teeth grinding.  Her sleep pattern includes going to bed between 11 PM and 1 AM, taking up to two hours to fall asleep, waking up hourly, and starting her day around 11 AM. She has a history of severe sleep apnea, which she believes was initially weight-induced during her pregnancies.  No current snoring but a history of snoring that may have improved with weight loss. She confirms waking up gasping or choking and excessive daytime sleepiness.  No concern for narcolepsy or cataplexy. [place] 9/24    12/10/2024- Interim hx Discussed the use of AI scribe software for clinical note transcription with the patient, who gave verbal consent to proceed.  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   GERD (gastroesophageal reflux disease)    Migraines    Neck mass    Obesity (BMI 30-39.9) 03/27/2012   PONV (postoperative nausea and vomiting)    PTSD (post-traumatic stress disorder) 1992   from childhood experiences   Shortness of breath    with preg with multiple only    Sleep apnea     Tobacco History: Tobacco Use History[2] Counseling given: Not Answered   Outpatient Medications Prior to Visit  Medication Sig Dispense Refill   albuterol  (VENTOLIN  HFA) 108 (90 Base) MCG/ACT inhaler Inhale 2 puffs into the lungs every 6 (six) hours as needed for wheezing or shortness of breath.      ALPRAZolam (XANAX) 0.5 MG tablet Take 0.25-0.5 mg by mouth 2 (two) times daily as needed for anxiety.      cetirizine (ZYRTEC) 10 MG tablet Take 10 mg by mouth at bedtime.      cyclobenzaprine  (FLEXERIL ) 5 MG tablet Take 1 tablet (5 mg total) by mouth at bedtime as needed for muscle spasms. (Patient taking differently: Take 5 mg by mouth at bedtime.) 10 tablet 0   famotidine  (PEPCID ) 40 MG tablet Take 1 tablet (40 mg total) by mouth daily. 30 tablet 0   hydrochlorothiazide (MICROZIDE) 12.5 MG capsule Take 12.5 mg by mouth daily.     ibuprofen  (ADVIL ) 800 MG tablet Take 800 mg by mouth 3 (three) times daily as needed.     lidocaine  (LIDODERM ) 5 % SMARTSIG:Patch(s) Topical Daily PRN     methocarbamol  (ROBAXIN ) 500 MG tablet Take 1 tablet (500 mg total) by mouth every 8 (eight) hours as needed for muscle spasms. 20 tablet 0   Multiple Vitamins-Minerals (MULTIVITAMIN WITH MINERALS) tablet Take 1 tablet by mouth daily.     nabumetone  (RELAFEN ) 750 MG tablet Take 1 tablet (750 mg total) by mouth 2 (two) times daily as needed. 60 tablet 3   oxyCODONE -acetaminophen  (PERCOCET/ROXICET) 5-325 MG tablet Take 1 tablet by mouth every 6 (six) hours as needed for severe pain. 15 tablet 0   pantoprazole  (PROTONIX ) 20 MG tablet Take 2 tablets (40 mg total) by mouth daily. 60 tablet 0   predniSONE  (DELTASONE ) 20 MG tablet Take 3 tablets (60 mg total) by mouth daily. (Patient not taking: Reported on 10/10/2024) 15 tablet 0   sertraline (ZOLOFT) 100 MG tablet Take 200 mg by mouth at bedtime.     SLYND  4 MG TABS TAKE 1 TABLET BY MOUTH DAILY 28 tablet 9   sucralfate  (CARAFATE ) 1 g tablet Take 1 tablet  (1 g total) by mouth 4 (four) times daily -  with meals and at bedtime. 120 tablet 0   Vitamin D, Ergocalciferol, (DRISDOL) 1.25 MG (50000 UNIT) CAPS capsule Take 50,000 Units by mouth every 7 (seven) days.     No facility-administered medications prior to visit.      Review of Systems  Review of Systems   Physical Exam  There were no vitals taken for this visit. Physical Exam  ***  Lab Results:  CBC    Component Value Date/Time   WBC 9.1 06/09/2023 1638   RBC 4.33 06/09/2023 1638   HGB 11.1 (L) 06/09/2023 1638   HCT 36.6 06/09/2023 1638   PLT 166 06/09/2023 1638   MCV 84.5 06/09/2023 1638   MCH 25.6 (L) 06/09/2023 1638   MCHC 30.3 06/09/2023 1638   RDW 16.0 (H) 06/09/2023 1638   LYMPHSABS 2.6 06/09/2023 1638   MONOABS 0.6 06/09/2023 1638  EOSABS 0.1 06/09/2023 1638   BASOSABS 0.1 06/09/2023 1638    BMET    Component Value Date/Time   NA 139 06/09/2023 1638   K 4.0 06/09/2023 1638   CL 105 06/09/2023 1638   CO2 26 06/09/2023 1638   GLUCOSE 82 06/09/2023 1638   BUN 7 06/09/2023 1638   CREATININE 0.61 06/09/2023 1638   CALCIUM 8.5 (L) 06/09/2023 1638   GFRNONAA >60 06/09/2023 1638   GFRAA >60 12/06/2019 1426    BNP No results found for: BNP  ProBNP No results found for: PROBNP  Imaging: No results found.   Assessment & Plan:   No problem-specific Assessment & Plan notes found for this encounter.   There are no diagnoses linked to this encounter.  Assessment and Plan Assessment & Plan       I personally spent a total of *** minutes in the care of the patient today including {Time Based Coding:210964241}.   Almarie LELON Ferrari, NP 12/09/2024    [1] No Known Allergies [2]  Social History Tobacco Use  Smoking Status Never  Smokeless Tobacco Never   "

## 2024-12-10 ENCOUNTER — Ambulatory Visit: Admitting: Primary Care

## 2024-12-16 ENCOUNTER — Emergency Department (HOSPITAL_BASED_OUTPATIENT_CLINIC_OR_DEPARTMENT_OTHER)

## 2024-12-16 ENCOUNTER — Emergency Department (HOSPITAL_BASED_OUTPATIENT_CLINIC_OR_DEPARTMENT_OTHER)
Admission: EM | Admit: 2024-12-16 | Discharge: 2024-12-16 | Disposition: A | Discharge status: Home / Self Care | Attending: Emergency Medicine | Admitting: Emergency Medicine

## 2024-12-16 ENCOUNTER — Encounter (HOSPITAL_BASED_OUTPATIENT_CLINIC_OR_DEPARTMENT_OTHER): Payer: Self-pay | Admitting: *Deleted

## 2024-12-16 ENCOUNTER — Other Ambulatory Visit: Payer: Self-pay

## 2024-12-16 DIAGNOSIS — R569 Unspecified convulsions: Secondary | ICD-10-CM | POA: Insufficient documentation

## 2024-12-16 LAB — CBC WITH DIFFERENTIAL/PLATELET
Abs Immature Granulocytes: 0.02 K/uL (ref 0.00–0.07)
Basophils Absolute: 0 K/uL (ref 0.0–0.1)
Basophils Relative: 0 %
Eosinophils Absolute: 0.1 K/uL (ref 0.0–0.5)
Eosinophils Relative: 1 %
HCT: 34.6 % — ABNORMAL LOW (ref 36.0–46.0)
Hemoglobin: 10.9 g/dL — ABNORMAL LOW (ref 12.0–15.0)
Immature Granulocytes: 0 %
Lymphocytes Relative: 25 %
Lymphs Abs: 2.3 K/uL (ref 0.7–4.0)
MCH: 25.8 pg — ABNORMAL LOW (ref 26.0–34.0)
MCHC: 31.5 g/dL (ref 30.0–36.0)
MCV: 81.8 fL (ref 80.0–100.0)
Monocytes Absolute: 0.7 K/uL (ref 0.1–1.0)
Monocytes Relative: 8 %
Neutro Abs: 5.9 K/uL (ref 1.7–7.7)
Neutrophils Relative %: 66 %
Platelets: 174 K/uL (ref 150–400)
RBC: 4.23 MIL/uL (ref 3.87–5.11)
RDW: 16.5 % — ABNORMAL HIGH (ref 11.5–15.5)
WBC: 9.1 K/uL (ref 4.0–10.5)
nRBC: 0 % (ref 0.0–0.2)

## 2024-12-16 LAB — COMPREHENSIVE METABOLIC PANEL WITH GFR
ALT: 42 U/L (ref 0–44)
AST: 30 U/L (ref 15–41)
Albumin: 4.2 g/dL (ref 3.5–5.0)
Alkaline Phosphatase: 188 U/L — ABNORMAL HIGH (ref 38–126)
Anion gap: 10 (ref 5–15)
BUN: 9 mg/dL (ref 6–20)
CO2: 26 mmol/L (ref 22–32)
Calcium: 8.3 mg/dL — ABNORMAL LOW (ref 8.9–10.3)
Chloride: 106 mmol/L (ref 98–111)
Creatinine, Ser: 0.51 mg/dL (ref 0.44–1.00)
GFR, Estimated: >60 mL/min
Glucose, Bld: 95 mg/dL (ref 70–99)
Potassium: 3.5 mmol/L (ref 3.5–5.1)
Sodium: 142 mmol/L (ref 135–145)
Total Bilirubin: 0.2 mg/dL (ref 0.0–1.2)
Total Protein: 6.8 g/dL (ref 6.5–8.1)

## 2024-12-16 LAB — HCG, SERUM, QUALITATIVE: Preg, Serum: NEGATIVE

## 2024-12-16 LAB — MAGNESIUM: Magnesium: 2 mg/dL (ref 1.7–2.4)

## 2024-12-16 MED ORDER — DIPHENHYDRAMINE HCL 50 MG/ML IJ SOLN
25.0000 mg | Freq: Once | INTRAMUSCULAR | Status: AC
  Administered 2024-12-16: 25 mg via INTRAVENOUS
  Filled 2024-12-16: qty 1

## 2024-12-16 MED ORDER — SODIUM CHLORIDE 0.9 % IV BOLUS
1000.0000 mL | Freq: Once | INTRAVENOUS | Status: AC
  Administered 2024-12-16: 1000 mL via INTRAVENOUS

## 2024-12-16 MED ORDER — METOCLOPRAMIDE HCL 5 MG/ML IJ SOLN
10.0000 mg | Freq: Once | INTRAMUSCULAR | Status: AC
  Administered 2024-12-16: 10 mg via INTRAVENOUS
  Filled 2024-12-16: qty 2

## 2024-12-16 NOTE — Discharge Instructions (Addendum)
 As we discussed you may have a seizure  Please do not drive or operate machinery until cleared by neurology  Your CT scan and labs were normal today  Please follow-up with neurology  Return to ER if you have another seizure or lethargy

## 2024-12-16 NOTE — ED Provider Notes (Signed)
 " [facility] Provider Note   CSN: [id number] Arrival date & time: 12/16/24  2131     Patient presents with: Seizures   Jaime Riggs is a 43 y.o. female who presenting with seizure-like activity.  Patient was in the car with a friend and became upset over something and had a tonic-clonic seizure activity.  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  She states that she was on Keppra at that time but has not been taking it.   The history is provided by the patient.       Prior to Admission medications  Medication Sig Start Date End Date Taking? Authorizing Provider  ALPRAZolam (XANAX) 0.5 MG tablet Take 0.25-0.5 mg by mouth 2 (two) times daily as needed for anxiety.  07/27/19  Yes [provider]  sertraline (ZOLOFT) 100 MG tablet Take 200 mg by mouth at bedtime. 11/13/19  Yes [provider]  albuterol  (VENTOLIN  HFA) 108 (90 Base) MCG/ACT inhaler Inhale 2 puffs into the lungs every 6 (six) hours as needed for wheezing or shortness of breath.  06/01/19   [provider]  cetirizine (ZYRTEC) 10 MG tablet Take 10 mg by mouth at bedtime.     [provider]  cyclobenzaprine  (FLEXERIL ) 5 MG tablet Take 1 tablet (5 mg total) by mouth at bedtime as needed for muscle spasms. Patient taking differently: Take 5 mg by mouth at bedtime. 05/30/18   Babara, Amy V, PA-C  famotidine  (PEPCID ) 40 MG tablet Take 1 tablet (40 mg total) by mouth daily. 02/13/22 03/15/22  Loeffler, Ronnald BROCKS, PA-C  hydrochlorothiazide (MICROZIDE) 12.5 MG capsule Take 12.5 mg by mouth daily. 07/16/21   [provider]  ibuprofen  (ADVIL ) 800 MG tablet Take 800 mg by mouth 3 (three) times daily as needed. 05/10/21   [provider]  lidocaine  (LIDODERM ) 5 % SMARTSIG:Patch(s) Topical Daily PRN 07/20/21   [provider]  methocarbamol  (ROBAXIN ) 500 MG tablet  Take 1 tablet (500 mg total) by mouth every 8 (eight) hours as needed for muscle spasms. 06/01/23   Long, Joshua G, MD  Multiple Vitamins-Minerals (MULTIVITAMIN WITH MINERALS) tablet Take 1 tablet by mouth daily.    [provider]  nabumetone  (RELAFEN ) 750 MG tablet Take 1 tablet (750 mg total) by mouth 2 (two) times daily as needed. 04/06/21   Vernetta Lonni GRADE, MD  oxyCODONE -acetaminophen  (PERCOCET/ROXICET) 5-325 MG tablet Take 1 tablet by mouth every 6 (six) hours as needed for severe pain. 02/13/22   Loeffler, Ronnald BROCKS, PA-C  pantoprazole  (PROTONIX ) 20 MG tablet Take 2 tablets (40 mg total) by mouth daily. 02/13/22 03/15/22  Loeffler, Ronnald BROCKS, PA-C  predniSONE  (DELTASONE ) 20 MG tablet Take 3 tablets (60 mg total) by mouth daily. Patient not taking: Reported on 10/10/2024 04/15/22   Roselyn Carlin NOVAK, MD  SLYND  4 MG TABS TAKE 1 TABLET BY MOUTH DAILY 12/08/23   Zina Jerilynn LABOR, MD  sucralfate  (CARAFATE ) 1 g tablet Take 1 tablet (1 g total) by mouth 4 (four) times daily -  with meals and at bedtime. 02/13/22 03/15/22  Loeffler, Ronnald BROCKS, PA-C  Vitamin D, Ergocalciferol, (DRISDOL) 1.25 MG (50000 UNIT) CAPS capsule Take 50,000 Units by mouth every 7 (seven) days. 02/08/22   [provider]    Allergies: Patient has no known allergies.    Review of Systems  Neurological:  Positive for seizures.  All other systems reviewed and are negative.   Updated Vital Signs BP (!) 160/107 (BP Location: Left Arm)   Pulse 85   Temp 98.2 F (36.8 C) (Oral)   Resp (!) 22   Ht 5' 6 (1.676 m)   Wt 116.2 kg   LMP  (LMP Unknown)   SpO2 100%   BMI 41.35 kg/m   Physical Exam Vitals and nursing note reviewed.  Constitutional:      Appearance: Normal appearance.     Comments: Tearful  HENT:     Head: Normocephalic.     Nose: Nose normal.     Mouth/Throat:     Mouth: Mucous membranes are moist.  Eyes:     Extraocular Movements: Extraocular movements intact.     Pupils: Pupils are  equal, round, and reactive to light.     Comments: No obvious eye deviation  Cardiovascular:     Rate and Rhythm: Normal rate and regular rhythm.     Pulses: Normal pulses.     Heart sounds: Normal heart sounds.  Pulmonary:     Effort: Pulmonary effort is normal.     Breath sounds: Normal breath sounds.  Abdominal:     General: Abdomen is flat.     Palpations: Abdomen is soft.  Musculoskeletal:        General: Normal range of motion.     Cervical back: Normal range of motion and neck supple.  Skin:    General: Skin is warm.     Capillary Refill: Capillary refill takes less than 2 seconds.  Neurological:     Mental Status: She is alert.     Comments: No eye deviation and normal strength bilateral arms and legs  Psychiatric:        Mood and Affect: Mood normal.        Behavior: Behavior normal.     (all labs ordered are listed, but only abnormal results are displayed) Labs Reviewed  CBC WITH DIFFERENTIAL/PLATELET - Abnormal; Notable for the following components:      Result Value   Hemoglobin 10.9 (*)    HCT 34.6 (*)    MCH 25.8 (*)    RDW 16.5 (*)    All other components within normal limits  COMPREHENSIVE METABOLIC PANEL WITH GFR - Abnormal; Notable for the following components:   Calcium 8.3 (*)    Alkaline Phosphatase 188 (*)    All other components within normal limits  MAGNESIUM  HCG, SERUM, QUALITATIVE    EKG: EKG Interpretation Date/Time:  Sunday December 16 2024 21:36:02 EST Ventricular Rate:  81 PR Interval:  223 QRS Duration:  95 QT Interval:  380 QTC Calculation: 442 R Axis:   -14  Text Interpretation: Sinus rhythm Prolonged PR interval No significant change since last tracing Confirmed by Patt Alm DEL [phone]) on 12/16/2024 11:08:11 PM  Radiology: No results found.   Procedures   Medications Ordered in the ED  metoCLOPramide  (REGLAN ) injection 10 mg (10 mg Intravenous Given 12/16/24 2205)  diphenhydrAMINE  (BENADRYL ) injection 25 mg (25 mg  Intravenous Given 12/16/24 2205)  sodium chloride  0.9 % bolus 1,000 mL (1,000 mLs Intravenous New Bag/Given 12/16/24 2205)                                    Medical Decision Making Jaime Riggs is a 43 y.o. female here presenting with seizure-like activity.  Patient is under a lot of  stress and had seizure activity.  Patient is back to baseline.  Plan to get CT head to rule out mass and check electrolytes.  Patient states that she does not want to get any seizure meds  11:21 PM CT head and labs unremarkable.  Patient states that she does not want to be started on any seizure meds and would like to follow-up with neurology outpatient   Amount and/or Complexity of Data Reviewed Labs: ordered. Radiology: ordered.  Risk Prescription drug management.    Final diagnoses:  None    ED Discharge Orders     None          Patt Alm Macho, MD 12/16/24 2334  "

## 2024-12-16 NOTE — ED Triage Notes (Signed)
 Pt BIB friend after patient had seizure (friend describes as violent jerking) in the car - Lasting 1 minute. Hx - of Epilepsy. Pt states she does not take any medications - Last seizure 8-12 months ago. Friend states - she has increased life stressors. Patient very tearful. Alert and oriented- in and out of postictal stage.

## 2024-12-16 NOTE — ED Triage Notes (Signed)
 Addition- states vomiting and loss of consciousness. Stop taking seizure med in 12 years.

## 2024-12-17 ENCOUNTER — Ambulatory Visit (HOSPITAL_BASED_OUTPATIENT_CLINIC_OR_DEPARTMENT_OTHER): Admitting: Pulmonary Disease

## 2024-12-26 ENCOUNTER — Encounter: Payer: Self-pay | Admitting: Neurology

## 2024-12-26 ENCOUNTER — Ambulatory Visit: Admitting: Neurology

## 2024-12-26 VITALS — BP 128/86 | HR 63 | Ht 67.0 in | Wt 242.0 lb

## 2024-12-26 DIAGNOSIS — R569 Unspecified convulsions: Secondary | ICD-10-CM

## 2024-12-26 NOTE — Progress Notes (Signed)
 "  [facility]  PATIENT: Jaime Riggs DOB: 1981/02/13  REQUESTING CLINICIAN: Patt Alm Macho, MD HISTORY FROM: Patient  REASON FOR VISIT: Seizure like events    HISTORICAL  CHIEF COMPLAINT:  Chief Complaint  Patient presents with   Seizures    Rm 12 alone Pt is well, reports she started having seizures when she was a child. Her last known was about 12 yrs ago. Last tonic-clonic seizure was at least 17 yrs.  Hasn't been on an anticonvulsant in 14 yrs.     HISTORY OF PRESENT ILLNESS:  Discussed the use of AI scribe software for clinical note transcription with the patient, who gave verbal consent to proceed.  Jaime Riggs is a 44 year old female with a history of seizures, anxiety, depression, obesity who presents after experiencing a possible seizure.  She experienced a seizure on December 28th, which she attributes to stress. Her typical seizures are stress-related and usually involve staring spells without convulsions. However, this recent event was a full-body seizure, which she has not experienced in approximately 12 years. She has a history of seizures starting around age 59, with both staring spells and convulsions noted in her childhood.  She recalls being on medication, possibly carbamazepine, about 20 years ago but has not taken any seizure medication since then. She describes the medication as having been a 'hot mess' and causing more harm than good. Prior to this recent event, her last seizure was about 12 years ago, which was a staring spell.  During the recent seizure, she did not bite her tongue or experience urinary incontinence, but she did vomit. Her best friend witnessed the event and reported that she had a full-body seizure while strapped in a seatbelt and subsequently vomited. She felt exhausted and tired after the seizure.  She mentions having PTSD and anxiety, for which she takes medication regularly. She believes her seizures  are stress-induced and has been managing her stress to prevent further episodes. She also reports significant memory loss, which she associates with her seizure history.  Family history is notable for seizures in her father's brother. She expresses concern about memory loss related to her epilepsy and seeks to understand if her memories can be recovered.    Handedness: Right handed   Onset: Since the age of approximately 89, last seizure was 12 years ago  Seizure Type: Staring spell, also has generalized convulsion  Current frequency: This event was on December 28 but prior event was 12 years ago  Any injuries from seizures: Denies   Seizure risk factors: Paternal Uncle    Previous ASMs: Carbamazepine   Currenty ASMs: None   ASMs side effects: Currently not applicable  Brain Images: No acute abnormality  Previous EEGs: Not available for review   OTHER MEDICAL CONDITIONS: Anxiety/depression, obesity, history of seizures  REVIEW OF SYSTEMS: Full 14 system review of systems performed and negative with exception of: As noted in the HPI   ALLERGIES: Allergies[1]  HOME MEDICATIONS: Outpatient Medications Prior to Visit  Medication Sig Dispense Refill   albuterol  (VENTOLIN  HFA) 108 (90 Base) MCG/ACT inhaler Inhale 2 puffs into the lungs every 6 (six) hours as needed for wheezing or shortness of breath.      ALPRAZolam (XANAX) 0.5 MG tablet Take 0.25-0.5 mg by mouth 2 (two) times daily as needed for anxiety.      cetirizine (ZYRTEC) 10 MG tablet Take 10 mg by mouth at bedtime.      Multiple Vitamins-Minerals (MULTIVITAMIN  WITH MINERALS) tablet Take 1 tablet by mouth daily.     nabumetone  (RELAFEN ) 750 MG tablet Take 1 tablet (750 mg total) by mouth 2 (two) times daily as needed. 60 tablet 3   sertraline (ZOLOFT) 100 MG tablet Take 200 mg by mouth at bedtime.     Vitamin D, Ergocalciferol, (DRISDOL) 1.25 MG (50000 UNIT) CAPS capsule Take 50,000 Units by mouth every 7 (seven) days.      cyclobenzaprine  (FLEXERIL ) 5 MG tablet Take 1 tablet (5 mg total) by mouth at bedtime as needed for muscle spasms. (Patient taking differently: Take 5 mg by mouth at bedtime.) 10 tablet 0   famotidine  (PEPCID ) 40 MG tablet Take 1 tablet (40 mg total) by mouth daily. 30 tablet 0   hydrochlorothiazide (MICROZIDE) 12.5 MG capsule Take 12.5 mg by mouth daily.     ibuprofen  (ADVIL ) 800 MG tablet Take 800 mg by mouth 3 (three) times daily as needed.     lidocaine  (LIDODERM ) 5 % SMARTSIG:Patch(s) Topical Daily PRN     methocarbamol  (ROBAXIN ) 500 MG tablet Take 1 tablet (500 mg total) by mouth every 8 (eight) hours as needed for muscle spasms. 20 tablet 0   oxyCODONE -acetaminophen  (PERCOCET/ROXICET) 5-325 MG tablet Take 1 tablet by mouth every 6 (six) hours as needed for severe pain. 15 tablet 0   pantoprazole  (PROTONIX ) 20 MG tablet Take 2 tablets (40 mg total) by mouth daily. 60 tablet 0   predniSONE  (DELTASONE ) 20 MG tablet Take 3 tablets (60 mg total) by mouth daily. (Patient not taking: Reported on 10/10/2024) 15 tablet 0   SLYND  4 MG TABS TAKE 1 TABLET BY MOUTH DAILY 28 tablet 9   sucralfate  (CARAFATE ) 1 g tablet Take 1 tablet (1 g total) by mouth 4 (four) times daily -  with meals and at bedtime. 120 tablet 0   No facility-administered medications prior to visit.    PAST MEDICAL HISTORY: Past Medical History:  Diagnosis Date   Asthma    Diabetes mellitus without complication (HCC)    Difficult intubation 12/06/2019   TMJ; small mouth; limited neck extension; redundant soft tissue   Epilepsy (HCC)    last episode  two yrs.   GERD (gastroesophageal reflux disease)    Migraines    Neck mass    Obesity (BMI 30-39.9) 03/27/2012   PONV (postoperative nausea and vomiting)    PTSD (post-traumatic stress disorder) 1992   from childhood experiences   Shortness of breath    with preg with multiple only   Sleep apnea     PAST SURGICAL HISTORY: Past Surgical History:  Procedure Laterality  Date   CARPAL TUNNEL RELEASE Left 12/06/2019   Procedure: CARPAL TUNNEL RELEASE-LEFT;  Surgeon: Gillie Duncans, MD;  Location: [facility] OR;  Service: Neurosurgery;  Laterality: Left;  CARPAL TUNNEL RELEASE-LEFT   CESAREAN SECTION     CESAREAN SECTION  12/04/2012   Procedure: CESAREAN SECTION;  Surgeon: Nathanel LELON Bunker, MD;  Location: [facility];  Service: Obstetrics;  Laterality: N/A;  repeat c/s-TWINS   LAPAROSCOPIC GASTRIC BAND REMOVAL WITH LAPAROSCOPIC GASTRIC SLEEVE RESECTION  2021   MOUTH SURGERY     NECK MASS EXCISION  04/04/2012   pathology c/w lipoma    FAMILY HISTORY: Family History  Problem Relation Age of Onset   Cancer Maternal Aunt        unknown   Cancer Maternal Grandfather        lung   Cancer Paternal Grandmother        ovarian  Other Neg Hx    Breast cancer Neg Hx     SOCIAL HISTORY: Social History   Socioeconomic History   Marital status: Married    Spouse name: Not on file   Number of children: Not on file   Years of education: Not on file   Highest education level: Not on file  Occupational History   Not on file  Tobacco Use   Smoking status: Never   Smokeless tobacco: Never  Vaping Use   Vaping status: Never Used  Substance and Sexual Activity   Alcohol use: Not Currently    Comment: occasional wine   Drug use: No   Sexual activity: Yes    Birth control/protection: Pill  Other Topics Concern   Not on file  Social History Narrative   Not on file   Social Drivers of Health   Tobacco Use: Low Risk (12/26/2024)   Patient History    Smoking Tobacco Use: Never    Smokeless Tobacco Use: Never    Passive Exposure: Not on file  Financial Resource Strain: Not on file  Food Insecurity: Low Risk (04/29/2023)   Received from [facility]   Epic    Within the past 12 months, you worried that your food would run out before you got money to buy more: Never true    Within the past 12 months, the food you bought just didn't last and you didn't have money to  get more. : Never true  Transportation Needs: No Transportation Needs (04/29/2023)   Received from [facility]    In the past 12 months, has lack of reliable transportation kept you from medical appointments, meetings, work or from getting things needed for daily living? : No  Physical Activity: Not on file  Stress: Not on file  Social Connections: Not on file  Intimate Partner Violence: Not on file  Depression (EYV7-0): Not on file  Alcohol Screen: Not on file  Housing: Low Risk (04/29/2023)   Received from [facility]   Epic    What is your living situation today?: I have a steady place to live    Think about the place you live. Do you have problems with any of the following? Choose all that apply:: None/None on this list  Utilities: Low Risk (04/29/2023)   Received from [facility]   Utilities    In the past 12 months has the electric, gas, oil, or water company threatened to shut off services in your home? : No  Health Literacy: Not on file    PHYSICAL EXAM  GENERAL EXAM/CONSTITUTIONAL: Vitals:  Vitals:   12/26/24 1412  BP: 128/86  Pulse: 63  Weight: 242 lb (109.8 kg)  Height: 5' 7 (1.702 m)   Body mass index is 37.9 kg/m. Wt Readings from Last 3 Encounters:  12/26/24 242 lb (109.8 kg)  12/16/24 256 lb 3.2 oz (116.2 kg)  10/10/24 240 lb 3.2 oz (109 kg)   Patient is in no distress; well developed, nourished and groomed; neck is supple  MUSCULOSKELETAL: Gait, strength, tone, movements noted in Neurologic exam below  NEUROLOGIC: MENTAL STATUS:      No data to display         awake, alert, oriented to person, place and time recent and remote memory intact normal attention and concentration language fluent, comprehension intact, naming intact fund of knowledge appropriate  CRANIAL NERVE:  2nd, 3rd, 4th, 6th - Visual fields full to confrontation, extraocular muscles intact, no nystagmus 5th -  facial sensation symmetric 7th - facial  strength symmetric 8th - hearing intact 9th - palate elevates symmetrically, uvula midline 11th - shoulder shrug symmetric 12th - tongue protrusion midline  MOTOR:  normal bulk and tone, full strength in the BUE, BLE  SENSORY:  normal and symmetric to light touch  COORDINATION:  finger-nose-finger, fine finger movements normal  GAIT/STATION:  normal    DIAGNOSTIC DATA (LABS, IMAGING, TESTING) - I reviewed patient records, labs, notes, testing and imaging myself where available.  Lab Results  Component Value Date   WBC 9.1 12/16/2024   HGB 10.9 (L) 12/16/2024   HCT 34.6 (L) 12/16/2024   MCV 81.8 12/16/2024   PLT 174 12/16/2024      Component Value Date/Time   NA 142 12/16/2024 2150   K 3.5 12/16/2024 2150   CL 106 12/16/2024 2150   CO2 26 12/16/2024 2150   GLUCOSE 95 12/16/2024 2150   BUN 9 12/16/2024 2150   CREATININE 0.51 12/16/2024 2150   CALCIUM 8.3 (L) 12/16/2024 2150   PROT 6.8 12/16/2024 2150   ALBUMIN 4.2 12/16/2024 2150   AST 30 12/16/2024 2150   ALT 42 12/16/2024 2150   ALKPHOS 188 (H) 12/16/2024 2150   BILITOT 0.2 12/16/2024 2150   GFRNONAA >60 12/16/2024 2150   GFRAA >60 12/06/2019 1426   No results found for: CHOL, HDL, LDLCALC, LDLDIRECT, TRIG No results found for: HGBA1C No results found for: VITAMINB12 No results found for: TSH  Head CT 12/16/2024 1. No acute intracranial abnormality.    ASSESSMENT AND PLAN  44 y.o. year old female  with anxiety/depression, obesity who is presenting after a seizure activity on December 28 described as generalized convulsion.  Patient reported seizure was induced by high stress and since then she has been doing better.  Seizure like activity  Recent full-body seizure like activity on December 28th, stress-induced per patient. Seizures since childhood, last significant event 12 years ago. Differential includes epileptic and non-epileptic seizures. Stress-induced seizures may be  non-epileptic. No recent medication use for seizures. No injury or tongue biting during the event, but vomiting occurred. Family history of seizures in paternal uncle. Discussed potential for both epileptic and non-epileptic seizures coexisting. Explained that epileptic seizures involve abnormal brain discharges, while non-epileptic seizures are stress-related. Discussed the importance of ruling out epileptic seizures through EEG. - Ordered EEG to assess for epileptic activity. - Instructed to report any further seizure events.    1. Seizures (HCC)     Patient Instructions  Routine EEG. I will contact you to go over the results  Please call us  if you do have another seizures  Continue to follow up with PCP  Return as needed    Per [place]  DMV statutes, patients with seizures are not allowed to drive until they have been seizure-free for six months.  Other recommendations include using caution when using heavy equipment or power tools. Avoid working on ladders or at heights. Take showers instead of baths.  Do not swim alone.  Ensure the water temperature is not too high on the home water heater. Do not go swimming alone. Do not lock yourself in a room alone (i.e. bathroom). When caring for infants or small children, sit down when holding, feeding, or changing them to minimize risk of injury to the child in the event you have a seizure. Maintain good sleep hygiene. Avoid alcohol.  Also recommend adequate sleep, hydration, good diet and minimize stress.   During the Seizure  - First, ensure  adequate ventilation and place patients on the floor on their left side  Loosen clothing around the neck and ensure the airway is patent. If the patient is clenching the teeth, do not force the mouth open with any object as this can cause severe damage - Remove all items from the surrounding that can be hazardous. The patient may be oblivious to what's happening and may not even know what he or she is  doing. If the patient is confused and wandering, either gently guide him/her away and block access to outside areas - Reassure the individual and be comforting - Call 911. In most cases, the seizure ends before EMS arrives. However, there are cases when seizures may last over 3 to 5 minutes. Or the individual may have developed breathing difficulties or severe injuries. If a pregnant patient or a person with diabetes develops a seizure, it is prudent to call an ambulance. - Finally, if the patient does not regain full consciousness, then call EMS. Most patients will remain confused for about 45 to 90 minutes after a seizure, so you must use judgment in calling for help. - Avoid restraints but make sure the patient is in a bed with padded side rails - Place the individual in a lateral position with the neck slightly flexed; this will help the saliva drain from the mouth and prevent the tongue from falling backward - Remove all nearby furniture and other hazards from the area - Provide verbal assurance as the individual is regaining consciousness - Provide the patient with privacy if possible - Call for help and start treatment as ordered by the caregiver   After the Seizure (Postictal Stage)  After a seizure, most patients experience confusion, fatigue, muscle pain and/or a headache. Thus, one should permit the individual to sleep. For the next few days, reassurance is essential. Being calm and helping reorient the person is also of importance.  Most seizures are painless and end spontaneously. Seizures are not harmful to others but can lead to complications such as stress on the lungs, brain and the heart. Individuals with prior lung problems may develop labored breathing and respiratory distress.    Discussed Patients with epilepsy have a small risk of sudden unexpected death, a condition referred to as sudden unexpected death in epilepsy (SUDEP). SUDEP is defined specifically as the sudden,  unexpected, witnessed or unwitnessed, nontraumatic and nondrowning death in patients with epilepsy with or without evidence for a seizure, and excluding documented status epilepticus, in which post mortem examination does not reveal a structural or toxicologic cause for death     Orders Placed This Encounter  Procedures   EEG adult    No orders of the defined types were placed in this encounter.   Return if symptoms worsen or fail to improve.    Pastor Falling, MD 12/26/2024, 4:44 PM  [facility] [address], [address] [place], [place] [address] [phone]     [1] No Known Allergies  "

## 2024-12-26 NOTE — Patient Instructions (Addendum)
 Routine EEG. I will contact you to go over the results  Please call us  if you do have another seizures  Continue to follow up with PCP  Return as needed

## 2024-12-31 ENCOUNTER — Ambulatory Visit (HOSPITAL_BASED_OUTPATIENT_CLINIC_OR_DEPARTMENT_OTHER): Admitting: Pulmonary Disease

## 2025-01-02 ENCOUNTER — Other Ambulatory Visit: Admitting: *Deleted

## 2025-01-18 ENCOUNTER — Telehealth: Payer: Self-pay

## 2025-01-18 NOTE — Telephone Encounter (Signed)
 PCC's, pt was supposed to completed a sleep study prior to appt. I see that her PSG was canceled due to insurance. Is there a reason her sleep study wasn't scheduled?

## 2025-01-18 NOTE — Telephone Encounter (Signed)
 Looks as though pt cancelled both appts for the split night study that have been scheduled for her last one being on 1/12. There is no order for a home sleep study

## 2025-01-18 NOTE — Telephone Encounter (Signed)
 Ill address on her visit 2/2

## 2025-01-21 ENCOUNTER — Telehealth: Admitting: Primary Care

## 2025-01-21 ENCOUNTER — Telehealth: Payer: Self-pay | Admitting: Primary Care

## 2025-01-21 DIAGNOSIS — G4733 Obstructive sleep apnea (adult) (pediatric): Secondary | ICD-10-CM

## 2025-01-21 DIAGNOSIS — E66813 Obesity, class 3: Secondary | ICD-10-CM

## 2025-01-21 DIAGNOSIS — Z8669 Personal history of other diseases of the nervous system and sense organs: Secondary | ICD-10-CM

## 2025-01-21 NOTE — Patient Instructions (Addendum)
" ° °  VISIT SUMMARY: During your visit, we discussed your ongoing issues with severe sleep apnea and your intolerance to CPAP therapy due to claustrophobia and anxiety. We reviewed your history of significant weight loss and your current use of Wegovy for weight management. We also explored potential alternative treatments for your sleep apnea, including the Inspire device.  YOUR PLAN: -OBSTRUCTIVE SLEEP APNEA: Obstructive sleep apnea is a condition where your airway becomes blocked during sleep, causing breathing interruptions. Due to your intolerance to CPAP therapy, we are considering the [facility] device, which is an implanted device that helps keep your airway open. We have ordered a home sleep study through [facility] to better understand your current sleep patterns. Please sleep in your usual position during this study. We will also verify if Medicaid covers the [facility] device for you. Continue with positional therapy and weight management with [facility] as discussed.  INSTRUCTIONS: Please complete the home sleep study as instructed and follow up with a MyChart message once it is done. We will review the results and discuss the next steps based on the findings. Additionally, we will confirm Medicaid coverage for the [facility] device and provide further guidance.    Contains text generated by [employer].      "

## 2025-01-23 NOTE — Telephone Encounter (Signed)
 Called and spoke with patient, advised her of information per Almarie Ferrari NP.  She verbalized understanding.  Nothing further needed.

## 2025-01-24 NOTE — Telephone Encounter (Signed)
 nfn

## 2025-02-07 ENCOUNTER — Encounter

## 2025-07-25 ENCOUNTER — Ambulatory Visit: Admitting: Neurology
